# Patient Record
Sex: Female | Born: 1995 | Race: White | Hispanic: No | Marital: Married | Smoking: Former smoker
Health system: Southern US, Community
[De-identification: ages and names within clinical notes are randomized; demographics above are authoritative.]

## PROBLEM LIST (undated history)

## (undated) DIAGNOSIS — R45851 Suicidal ideations: Secondary | ICD-10-CM

## (undated) DIAGNOSIS — Z789 Other specified health status: Secondary | ICD-10-CM

## (undated) DIAGNOSIS — E119 Type 2 diabetes mellitus without complications: Secondary | ICD-10-CM

## (undated) DIAGNOSIS — I10 Essential (primary) hypertension: Secondary | ICD-10-CM

## (undated) DIAGNOSIS — F32A Depression, unspecified: Secondary | ICD-10-CM

## (undated) DIAGNOSIS — R569 Unspecified convulsions: Secondary | ICD-10-CM

## (undated) DIAGNOSIS — F419 Anxiety disorder, unspecified: Secondary | ICD-10-CM

## (undated) DIAGNOSIS — G43909 Migraine, unspecified, not intractable, without status migrainosus: Secondary | ICD-10-CM

## (undated) DIAGNOSIS — E785 Hyperlipidemia, unspecified: Secondary | ICD-10-CM

## (undated) HISTORY — DX: Anxiety disorder, unspecified: F41.9

## (undated) HISTORY — DX: Type 2 diabetes mellitus without complications: E11.9

## (undated) HISTORY — DX: Migraine, unspecified, not intractable, without status migrainosus: G43.909

## (undated) HISTORY — DX: Unspecified convulsions: R56.9

## (undated) HISTORY — DX: Suicidal ideations: R45.851

## (undated) HISTORY — PX: TONSILLECTOMY: SUR1361

## (undated) HISTORY — DX: Hyperlipidemia, unspecified: E78.5

## (undated) HISTORY — DX: Depression, unspecified: F32.A

---

## 2020-11-21 ENCOUNTER — Other Ambulatory Visit: Payer: Self-pay

## 2020-11-21 ENCOUNTER — Emergency Department: Payer: Self-pay

## 2020-11-21 DIAGNOSIS — K8066 Calculus of gallbladder and bile duct with acute and chronic cholecystitis without obstruction: Secondary | ICD-10-CM | POA: Insufficient documentation

## 2020-11-21 DIAGNOSIS — F17210 Nicotine dependence, cigarettes, uncomplicated: Secondary | ICD-10-CM | POA: Insufficient documentation

## 2020-11-21 LAB — CBC
HCT: 40.4 % (ref 36.0–46.0)
Hemoglobin: 13.5 g/dL (ref 12.0–15.0)
MCH: 30.1 pg (ref 26.0–34.0)
MCHC: 33.4 g/dL (ref 30.0–36.0)
MCV: 90 fL (ref 80.0–100.0)
Platelets: 290 10*3/uL (ref 150–400)
RBC: 4.49 MIL/uL (ref 3.87–5.11)
RDW: 13.6 % (ref 11.5–15.5)
WBC: 8.3 10*3/uL (ref 4.0–10.5)
nRBC: 0 % (ref 0.0–0.2)

## 2020-11-21 LAB — BASIC METABOLIC PANEL
Anion gap: 9 (ref 5–15)
BUN: 10 mg/dL (ref 6–20)
CO2: 25 mmol/L (ref 22–32)
Calcium: 9.3 mg/dL (ref 8.9–10.3)
Chloride: 106 mmol/L (ref 98–111)
Creatinine, Ser: 0.75 mg/dL (ref 0.44–1.00)
GFR, Estimated: >60 mL/min (ref >60)
Glucose, Bld: 105 mg/dL — ABNORMAL HIGH (ref 70–99)
Potassium: 3.6 mmol/L (ref 3.5–5.1)
Sodium: 140 mmol/L (ref 135–145)

## 2020-11-21 LAB — TROPONIN I (HIGH SENSITIVITY): Troponin I (High Sensitivity): <2 ng/L (ref <18)

## 2020-11-21 NOTE — ED Triage Notes (Signed)
Pt presents to ER c/o right-sided chest pain that has been persistent since July last year but tonight has become worse and radiates to her right side of back and is also worse with inspiration.  Pt denies hx of asthma.  Pt endorses hx of anxiety but states she has had no anxiety attacks recently.  Pt in NAD in triage.  Pt ambulatory to triage.

## 2020-11-22 ENCOUNTER — Emergency Department: Payer: Self-pay

## 2020-11-22 ENCOUNTER — Emergency Department
Admission: EM | Admit: 2020-11-22 | Discharge: 2020-11-22 | Disposition: A | Discharge status: Home / Self Care | Payer: Self-pay | Attending: Emergency Medicine | Admitting: Emergency Medicine

## 2020-11-22 DIAGNOSIS — K8050 Calculus of bile duct without cholangitis or cholecystitis without obstruction: Secondary | ICD-10-CM

## 2020-11-22 LAB — LIPASE, BLOOD: Lipase: 27 U/L (ref 11–51)

## 2020-11-22 LAB — HEPATIC FUNCTION PANEL
ALT: 18 U/L (ref 0–44)
AST: 19 U/L (ref 15–41)
Albumin: 4.3 g/dL (ref 3.5–5.0)
Alkaline Phosphatase: 55 U/L (ref 38–126)
Bilirubin, Direct: <0.1 mg/dL (ref 0.0–0.2)
Total Bilirubin: 0.6 mg/dL (ref 0.3–1.2)
Total Protein: 7.8 g/dL (ref 6.5–8.1)

## 2020-11-22 LAB — TROPONIN I (HIGH SENSITIVITY): Troponin I (High Sensitivity): <2 ng/L (ref <18)

## 2020-11-22 LAB — POC URINE PREG, ED: Preg Test, Ur: NEGATIVE — NL

## 2020-11-22 MED ORDER — OXYCODONE-ACETAMINOPHEN 5-325 MG PO TABS
2.0000 | ORAL_TABLET | Freq: Once | ORAL | Status: AC
  Administered 2020-11-22: 2 via ORAL
  Filled 2020-11-22: qty 2

## 2020-11-22 MED ORDER — DOCUSATE SODIUM 100 MG PO CAPS: ORAL_CAPSULE | ORAL | 0 refills | Status: AC

## 2020-11-22 MED ORDER — DICYCLOMINE HCL 10 MG PO CAPS
10.0000 mg | ORAL_CAPSULE | Freq: Once | ORAL | Status: AC
  Administered 2020-11-22: 10 mg via ORAL
  Filled 2020-11-22: qty 1

## 2020-11-22 MED ORDER — ONDANSETRON 4 MG PO TBDP
4.0000 mg | ORAL_TABLET | Freq: Once | ORAL | Status: AC
  Administered 2020-11-22: 4 mg via ORAL
  Filled 2020-11-22: qty 1

## 2020-11-22 MED ORDER — ONDANSETRON 4 MG PO TBDP: ORAL_TABLET | ORAL | 0 refills | Status: AC

## 2020-11-22 MED ORDER — OXYCODONE-ACETAMINOPHEN 5-325 MG PO TABS: 2.0000 | ORAL_TABLET | Freq: Four times a day (QID) | ORAL | 0 refills | Status: DC | PRN

## 2020-11-22 NOTE — ED Notes (Signed)
Pt ambulatory from triage -- now sitting up to edge of bed awaiting provider.  Anxious expression noted - denies any needs questions concerns at this time.

## 2020-11-22 NOTE — ED Notes (Signed)
ED Provider at bedside. 

## 2020-11-22 NOTE — ED Notes (Signed)
[facility] ELECTRONIC SIGNATURE UNAVAILABLE-- HARDCOPY OF DISCHARGE SIGNATURE PAGE SIGNED AND FILED

## 2020-11-22 NOTE — ED Notes (Addendum)
Pt reports ongoing R side cp radiating to the back - states sore, aching and TTP - denies recent injury, trauma fall - states pain has been constant since symptom onset 1400hrs day of arrival.  RR even and unlabored on RA -- lung sounds CTA.  S1 and S2 regular upon auscultation.  Adomen soft, nontender- pt denies nausea but states vomiting episode while in waiting room.  Pt also mentions has had similar pain complaint in the past but had never been this severe and had never persisted for more than three hours - states in the past pain relieved with hot showers and/or muscle relaxer

## 2020-11-22 NOTE — Discharge Instructions (Addendum)
You have been seen in the Emergency Department (ED) for abdominal pain.  Your evaluation suggests that your pain is caused by gallstones.  Fortunately you do not need immediate surgery at this time, but it is important that you follow up with a surgeon as an outpatient; typically surgical removal of the gallbladder is the only thing that will definitively fix your issue.  Read through the included information about a bland diet, and use any prescribed medications as instructed.  Avoid smoking and alcohol use. ° °Please follow up as instructed above regarding today’s emergent visit and the symptoms that are bothering you. ° °Take Percocet as prescribed. Do not drink alcohol, drive or participate in any other potentially dangerous activities while taking this medication as it may make you sleepy. Do not take this medication with any other sedating medications, either prescription or over-the-counter. If you were prescribed Percocet or Vicodin, do not take these with acetaminophen (Tylenol) as it is already contained within these medications. °  °This medication is an opiate (or narcotic) pain medication and can be habit forming.  Use it as little as possible to achieve adequate pain control.  Do not use or use it with extreme caution if you have a history of opiate abuse or dependence.  If you are on a pain contract with your primary care doctor or a pain specialist, be sure to let them know you were prescribed this medication today from the [facility].  This medication is intended for your use only - do not give any to anyone else and keep it in a secure place where nobody else, especially children, have access to it.  It will also cause or worsen constipation, so you may want to consider taking an over-the-counter stool softener while you are taking this medication. ° °Return to the ED if your abdominal pain worsens or fails to improve, you develop bloody vomiting, bloody diarrhea, you  are unable to tolerate fluids due to vomiting, fever greater than 101, or other symptoms that concern you. ° °

## 2020-11-22 NOTE — ED Provider Notes (Signed)
[facility] Emergency Department Provider Note  ____________________________________________   Event Date/Time   First MD Initiated Contact with Patient 11/22/20 [phone]     (approximate)  I have reviewed the triage vital signs and the nursing notes.   HISTORY  Chief Complaint Chest Pain    HPI Jasmine Sanchez is a 25 y.o. female reports no contributory chronic medical issues and presents for evaluation of episodic pain in the right side of her chest that has been going on for months.  She said that it used to happen about once a month but it has been having a lot more often recently and the pain has been constant for at least a day.  It is a sharp stabbing pain in the right side of her lower chest that radiates straight through to her back.  Nothing in particular seems to make it better or worse including eating or drinking, movement, or breathing, although when she takes deep breaths recently it seems to be worse.  She has no recent fever or sore throat.  No other chest pain.  No shortness of breath, just the occasional pain with deep inspiration.  She says it feels like the sharp pain radiates through to her back.  No lower abdominal pain.  No dysuria.  No history of blood clots in the legs of the lungs, no recent surgeries or immobilizations, no unilateral leg pain or swelling.         History reviewed. No pertinent past medical history.  There are no problems to display for this patient.   Past Surgical History:  Procedure Laterality Date  . TONSILLECTOMY      Prior to Admission medications   Medication Sig Start Date End Date Taking? Authorizing Provider  docusate sodium (COLACE) 100 MG capsule Take 1 tablet once or twice daily as needed for constipation while taking narcotic pain medicine 11/22/20  Yes Loleta Rose, MD  ondansetron (ZOFRAN ODT) 4 MG disintegrating tablet Allow 1-2 tablets to dissolve in your mouth every 8 hours as needed for  nausea/vomiting 11/22/20  Yes Loleta Rose, MD  oxyCODONE-acetaminophen (PERCOCET) 5-325 MG tablet Take 2 tablets by mouth every 6 (six) hours as needed for severe pain. 11/22/20  Yes Loleta Rose, MD    Allergies Penicillins  History reviewed. No pertinent family history.  Social History Social History   Tobacco Use  . Smoking status: Current Every Day Smoker    Packs/day: 0.20    Types: Cigarettes  . Smokeless tobacco: Never Used  Substance Use Topics  . Alcohol use: Yes    Comment: occasional  . Drug use: Never    Review of Systems Constitutional: No fever/chills Eyes: No visual changes. ENT: No sore throat. Cardiovascular: Episodic right-sided chest pain for months. Respiratory: Denies shortness of breath. Gastrointestinal: No abdominal pain.  No nausea, no vomiting.  No diarrhea.  No constipation. Genitourinary: Negative for dysuria. Musculoskeletal: Negative for neck pain.  Negative for back pain. Integumentary: Negative for rash. Neurological: Negative for headaches, focal weakness or numbness.   ____________________________________________   PHYSICAL EXAM:  VITAL SIGNS: ED Triage Vitals  Enc Vitals Group     BP 11/21/20 2248 (!) 149/91     Pulse Rate 11/21/20 2248 65     Resp 11/21/20 2248 (!) 22     Temp 11/21/20 2248 98.9 F (37.2 C)     Temp Source 11/21/20 2248 Oral     SpO2 11/21/20 2248 100 %     Weight 11/21/20 2249  133.8 kg (295 lb)     Height 11/21/20 2249 1.778 m (5\' 10" )     Head Circumference --      Peak Flow --      Pain Score 11/21/20 2249 8     Pain Loc --      Pain Edu? --      Excl. in GC? --     Constitutional: Alert and oriented.  Eyes: Conjunctivae are normal.  Head: Atraumatic. Nose: No congestion/rhinnorhea. Mouth/Throat: Patient is wearing a mask. Neck: No stridor.  No meningeal signs.   Cardiovascular: Normal rate, regular rhythm. Good peripheral circulation.  No chest wall tenderness. Respiratory: Normal respiratory  effort.  No retractions. Gastrointestinal: Obese.  Soft and nondistended.  Highly reproducible tenderness to palpation of the epigastrium and right upper quadrant with positive Murphy sign.  No lower abdominal tenderness. Musculoskeletal: No lower extremity tenderness nor edema. No gross deformities of extremities. Neurologic:  Normal speech and language. No gross focal neurologic deficits are appreciated.  Skin:  Skin is warm, dry and intact.   ____________________________________________   LABS (all labs ordered are listed, but only abnormal results are displayed)  Labs Reviewed  BASIC METABOLIC PANEL - Abnormal; Notable for the following components:      Result Value   Glucose, Bld 105 (*)    All other components within normal limits  POC URINE PREG, ED - Normal  CBC  HEPATIC FUNCTION PANEL  LIPASE, BLOOD  TROPONIN I (HIGH SENSITIVITY)  TROPONIN I (HIGH SENSITIVITY)   ____________________________________________  EKG  ED ECG REPORT I, Loleta Rose, the attending physician, personally viewed and interpreted this ECG.  Date: 11/21/2020 EKG Time: 22: 42 Rate: 75 Rhythm: normal sinus rhythm with sinus arrhythmia. QRS Axis: normal Intervals: normal ST/T Wave abnormalities: normal Narrative Interpretation: no evidence of acute ischemia  ____________________________________________  RADIOLOGY I, Loleta Rose, personally viewed and evaluated these images (plain radiographs) as part of my medical decision making, as well as reviewing the written report by the radiologist.  ED MD interpretation: Linear atelectasis anteriorly on the lateral view, no other acute abnormality identified.  Official radiology report(s): DG Chest 2 View  Result Date: 11/21/2020 CLINICAL DATA:  Chest pain, shortness of breath EXAM: CHEST - 2 VIEW COMPARISON:  None. FINDINGS: Linear atelectasis anteriorly at the lung bases on the lateral view. No confluent opacities otherwise. Heart is normal size.  No effusions or acute bony abnormality. IMPRESSION: Linear atelectasis anteriorly on the lateral view. No active disease. Electronically Signed   By: Charlett Nose M.D.   On: 11/21/2020 23:31   US ABDOMEN LIMITED RUQ (LIVER/GB)  Result Date: 11/22/2020 CLINICAL DATA:  Episodic epigastric and right upper quadrant pain EXAM: ULTRASOUND ABDOMEN LIMITED RIGHT UPPER QUADRANT COMPARISON:  None. FINDINGS: Gallbladder: 16 mm gallstone at the gallbladder neck with adjacent sludge. There are additional smaller calculi seen towards the fundus. Borderline gallbladder wall thickening to 3-4 mm. No focal tenderness per sonographer exam. Common bile duct: Diameter: 4 mm Liver: No focal lesion identified. Within normal limits in parenchymal echogenicity. Portal vein is patent on color Doppler imaging with normal direction of blood flow towards the liver. Other: Detail is diminished by soft tissue thickness. IMPRESSION: Cholelithiasis including stone fixed at the neck. Borderline gallbladder wall thickening but no focal tenderness or adjacent edema to implicate acute cholecystitis. Electronically Signed   By: Marnee Spring M.D.   On: 11/22/2020 05:15    ____________________________________________   PROCEDURES   Procedure(s) performed (including Critical Care):  Procedures   ____________________________________________   INITIAL IMPRESSION / MDM / ASSESSMENT AND PLAN / ED COURSE  As part of my medical decision making, I reviewed the following data within the electronic MEDICAL RECORD NUMBER Nursing notes reviewed and incorporated, Labs reviewed , EKG interpreted , Old chart reviewed, Radiograph reviewed , Notes from prior ED visits and [place] Controlled Substance Database   Differential diagnosis includes, but is not limited to, biliary colic, musculoskeletal pain, PE, pneumonia, less likely ACS.  Patient has no risk factors for PE and is PERC negative.  Very low risk for ACS based on HEAR score.  Physical exam  suggest GI source, specifically gallbladder, rather than thoracic source of her pain.  She is highly sensitive to palpation of the epigastrium and right upper quadrant.  No lower abdominal tenderness.  Vital signs are within normal limits.  CBC normal with no leukocytosis.  High-sensitivity troponin negative x2.  Basic metabolic panel is within normal limits.  I added on hepatic function panel and lipase and have ordered a right upper quadrant ultrasound.  The patient does not feel that she has IV access so hold off on analgesia at this time until after the ultrasound and I will consider p.o. meds.  Patient understands and agrees with the current plan of care.     Clinical Course as of 11/22/20 0540  Sat Nov 22, 2020  0431 Normal hepatic function panel and lipase. [CF]  0537 US ABDOMEN LIMITED RUQ (LIVER/GB) Patient's work-up is consistent with biliary colic.  She has a gallstone in the neck of the gallbladder.  However she is more comfortable now you know the pain persists.  She has not had any vomiting.  There is no sonographic evidence of cholecystitis.  She does not meet criteria for urgent surgery.  I had a discussion with her and her companion about biliary colic and the importance of close outpatient follow-up with surgery.  I gave her 2 Percocet and a Zofran 4 mg ODT as well as Bentyl 10 mg by mouth and prescriptions as listed below.  She knows that it is important to follow-up with surgery and I gave my usual and customary return precautions [CF]    Clinical Course User Index [CF] Loleta Rose, MD     ____________________________________________  FINAL CLINICAL IMPRESSION(S) / ED DIAGNOSES  Final diagnoses:  Biliary colic     MEDICATIONS GIVEN DURING THIS VISIT:  Medications  oxyCODONE-acetaminophen (PERCOCET/ROXICET) 5-325 MG per tablet 2 tablet (has no administration in time range)  ondansetron (ZOFRAN-ODT) disintegrating tablet 4 mg (has no administration in time range)   dicyclomine (BENTYL) capsule 10 mg (has no administration in time range)     ED Discharge Orders         Ordered    oxyCODONE-acetaminophen (PERCOCET) 5-325 MG tablet  Every 6 hours PRN        11/22/20 0537    ondansetron (ZOFRAN ODT) 4 MG disintegrating tablet        11/22/20 0537    docusate sodium (COLACE) 100 MG capsule        11/22/20 0537          *Please note:  Jasmine Sanchez was evaluated in Emergency Department on 11/22/2020 for the symptoms described in the history of present illness. She was evaluated in the context of the global COVID-19 pandemic, which necessitated consideration that the patient might be at risk for infection with the SARS-CoV-2 virus that causes COVID-19. Institutional protocols and algorithms that pertain to the  evaluation of patients at risk for COVID-19 are in a state of rapid change based on information released by regulatory bodies including the CDC and federal and state organizations. These policies and algorithms were followed during the patient's care in the ED.  Some ED evaluations and interventions may be delayed as a result of limited staffing during and after the pandemic.*  Note:  This document was prepared using Dragon voice recognition software and may include unintentional dictation errors.   Loleta Rose, MD 11/22/20 [phone]

## 2020-11-25 ENCOUNTER — Telehealth: Payer: Self-pay | Admitting: Surgery

## 2020-11-25 NOTE — Telephone Encounter (Signed)
Outbound call made to pt in an attempt to schedule ED follow up appt.  Pt advised she is currently feeling well; no recurring issues since being seen/tx'd in the ED & stated she is uninsured, therefore she will attempt to go through all of the pt assistance ppwk that was given to her while in the ED.  Furthermore, she is looking to follow up care through [facility] but will reach out to the office if she wishes to schedule @ [facility].

## 2021-01-24 ENCOUNTER — Emergency Department (HOSPITAL_COMMUNITY): Payer: Self-pay

## 2021-01-24 ENCOUNTER — Ambulatory Visit (HOSPITAL_COMMUNITY)
Admission: EM | Admit: 2021-01-24 | Discharge: 2021-01-24 | Disposition: A | Discharge status: Home / Self Care | Payer: Self-pay | Attending: Emergency Medicine | Admitting: Emergency Medicine

## 2021-01-24 ENCOUNTER — Emergency Department (HOSPITAL_COMMUNITY): Payer: Self-pay | Admitting: Anesthesiology

## 2021-01-24 ENCOUNTER — Encounter (HOSPITAL_COMMUNITY): Payer: Self-pay

## 2021-01-24 ENCOUNTER — Encounter (HOSPITAL_COMMUNITY)
Admission: EM | Disposition: A | Discharge status: Home / Self Care | Payer: Self-pay | Source: Home / Self Care | Attending: Emergency Medicine

## 2021-01-24 ENCOUNTER — Other Ambulatory Visit: Payer: Self-pay

## 2021-01-24 DIAGNOSIS — Z87891 Personal history of nicotine dependence: Secondary | ICD-10-CM | POA: Insufficient documentation

## 2021-01-24 DIAGNOSIS — K8020 Calculus of gallbladder without cholecystitis without obstruction: Secondary | ICD-10-CM

## 2021-01-24 DIAGNOSIS — Z880 Allergy status to penicillin: Secondary | ICD-10-CM | POA: Insufficient documentation

## 2021-01-24 DIAGNOSIS — Z20822 Contact with and (suspected) exposure to covid-19: Secondary | ICD-10-CM | POA: Insufficient documentation

## 2021-01-24 DIAGNOSIS — R1011 Right upper quadrant pain: Secondary | ICD-10-CM

## 2021-01-24 DIAGNOSIS — K8010 Calculus of gallbladder with chronic cholecystitis without obstruction: Secondary | ICD-10-CM | POA: Insufficient documentation

## 2021-01-24 DIAGNOSIS — N3000 Acute cystitis without hematuria: Secondary | ICD-10-CM | POA: Insufficient documentation

## 2021-01-24 HISTORY — PX: CHOLECYSTECTOMY: SHX55

## 2021-01-24 HISTORY — DX: Other specified health status: Z78.9

## 2021-01-24 LAB — COMPREHENSIVE METABOLIC PANEL
ALT: 95 U/L — ABNORMAL HIGH (ref 0–44)
AST: 65 U/L — ABNORMAL HIGH (ref 15–41)
Albumin: 4 g/dL (ref 3.5–5.0)
Alkaline Phosphatase: 180 U/L — ABNORMAL HIGH (ref 38–126)
Anion gap: 6 (ref 5–15)
BUN: 11 mg/dL (ref 6–20)
CO2: 30 mmol/L (ref 22–32)
Calcium: 9.1 mg/dL (ref 8.9–10.3)
Chloride: 104 mmol/L (ref 98–111)
Creatinine, Ser: 0.83 mg/dL (ref 0.44–1.00)
GFR, Estimated: >60 mL/min (ref >60)
Glucose, Bld: 117 mg/dL — ABNORMAL HIGH (ref 70–99)
Potassium: 3.5 mmol/L (ref 3.5–5.1)
Sodium: 140 mmol/L (ref 135–145)
Total Bilirubin: 0.4 mg/dL (ref 0.3–1.2)
Total Protein: 7.2 g/dL (ref 6.5–8.1)

## 2021-01-24 LAB — CBC
HCT: 40.3 % (ref 36.0–46.0)
Hemoglobin: 13.1 g/dL (ref 12.0–15.0)
MCH: 30.2 pg (ref 26.0–34.0)
MCHC: 32.5 g/dL (ref 30.0–36.0)
MCV: 92.9 fL (ref 80.0–100.0)
Platelets: 303 10*3/uL (ref 150–400)
RBC: 4.34 MIL/uL (ref 3.87–5.11)
RDW: 13.4 % (ref 11.5–15.5)
WBC: 9.1 10*3/uL (ref 4.0–10.5)
nRBC: 0 % (ref 0.0–0.2)

## 2021-01-24 LAB — URINALYSIS, ROUTINE W REFLEX MICROSCOPIC
Bilirubin Urine: NEGATIVE
Glucose, UA: NEGATIVE mg/dL
Hgb urine dipstick: NEGATIVE
Ketones, ur: NEGATIVE mg/dL
Nitrite: NEGATIVE
Protein, ur: NEGATIVE mg/dL
Specific Gravity, Urine: 1.019 (ref 1.005–1.030)
WBC, UA: >50 WBC/hpf — ABNORMAL HIGH (ref 0–5)
pH: 5 (ref 5.0–8.0)

## 2021-01-24 LAB — I-STAT BETA HCG BLOOD, ED (MC, WL, AP ONLY): I-stat hCG, quantitative: <5 m[IU]/mL (ref <5)

## 2021-01-24 LAB — RESP PANEL BY RT-PCR (FLU A&B, COVID) ARPGX2
Influenza A by PCR: NEGATIVE
Influenza B by PCR: NEGATIVE
SARS Coronavirus 2 by RT PCR: NEGATIVE

## 2021-01-24 LAB — LIPASE, BLOOD: Lipase: 29 U/L (ref 11–51)

## 2021-01-24 SURGERY — LAPAROSCOPIC CHOLECYSTECTOMY WITH INTRAOPERATIVE CHOLANGIOGRAM
Anesthesia: General | Site: Abdomen

## 2021-01-24 MED ORDER — SUCCINYLCHOLINE CHLORIDE 20 MG/ML IJ SOLN
INTRAMUSCULAR | Status: DC | PRN
  Administered 2021-01-24: 120 mg via INTRAVENOUS

## 2021-01-24 MED ORDER — FENTANYL CITRATE (PF) 100 MCG/2ML IJ SOLN
25.0000 ug | INTRAMUSCULAR | Status: DC | PRN
  Administered 2021-01-24: 50 ug via INTRAVENOUS

## 2021-01-24 MED ORDER — DEXAMETHASONE SODIUM PHOSPHATE 10 MG/ML IJ SOLN
INTRAMUSCULAR | Status: DC | PRN
  Administered 2021-01-24: 10 mg via INTRAVENOUS

## 2021-01-24 MED ORDER — SODIUM CHLORIDE 0.9 % IV BOLUS
1000.0000 mL | Freq: Once | INTRAVENOUS | Status: AC
  Administered 2021-01-24: 1000 mL via INTRAVENOUS

## 2021-01-24 MED ORDER — LIDOCAINE HCL (PF) 1 % IJ SOLN
INTRAMUSCULAR | Status: AC
  Filled 2021-01-24: qty 30

## 2021-01-24 MED ORDER — PROMETHAZINE HCL 25 MG/ML IJ SOLN: 6.2500 mg | INTRAMUSCULAR | Status: DC | PRN

## 2021-01-24 MED ORDER — ONDANSETRON HCL 4 MG/2ML IJ SOLN
4.0000 mg | Freq: Once | INTRAMUSCULAR | Status: AC
  Administered 2021-01-24: 4 mg via INTRAVENOUS
  Filled 2021-01-24: qty 2

## 2021-01-24 MED ORDER — BUPIVACAINE-EPINEPHRINE 0.25% -1:200000 IJ SOLN
INTRAMUSCULAR | Status: DC | PRN
  Administered 2021-01-24: 20 mL

## 2021-01-24 MED ORDER — FENTANYL CITRATE (PF) 250 MCG/5ML IJ SOLN
INTRAMUSCULAR | Status: AC
  Filled 2021-01-24: qty 5

## 2021-01-24 MED ORDER — PROPOFOL 10 MG/ML IV BOLUS
INTRAVENOUS | Status: AC
  Filled 2021-01-24: qty 20

## 2021-01-24 MED ORDER — FENTANYL CITRATE (PF) 100 MCG/2ML IJ SOLN
INTRAMUSCULAR | Status: DC | PRN
  Administered 2021-01-24: 50 ug via INTRAVENOUS
  Administered 2021-01-24: 100 ug via INTRAVENOUS

## 2021-01-24 MED ORDER — 0.9 % SODIUM CHLORIDE (POUR BTL) OPTIME
TOPICAL | Status: DC | PRN
  Administered 2021-01-24: 1000 mL

## 2021-01-24 MED ORDER — PROPOFOL 10 MG/ML IV BOLUS
INTRAVENOUS | Status: DC | PRN
  Administered 2021-01-24: 170 mg via INTRAVENOUS

## 2021-01-24 MED ORDER — ROCURONIUM BROMIDE 10 MG/ML (PF) SYRINGE
PREFILLED_SYRINGE | INTRAVENOUS | Status: DC | PRN
  Administered 2021-01-24 (×2): 50 mg via INTRAVENOUS

## 2021-01-24 MED ORDER — LACTATED RINGERS IV SOLN: INTRAVENOUS | Status: DC

## 2021-01-24 MED ORDER — ACETAMINOPHEN 10 MG/ML IV SOLN
INTRAVENOUS | Status: DC | PRN
  Administered 2021-01-24: 1000 mg via INTRAVENOUS

## 2021-01-24 MED ORDER — MIDAZOLAM HCL 2 MG/2ML IJ SOLN
INTRAMUSCULAR | Status: AC
  Filled 2021-01-24: qty 2

## 2021-01-24 MED ORDER — MIDAZOLAM HCL 5 MG/5ML IJ SOLN
INTRAMUSCULAR | Status: DC | PRN
  Administered 2021-01-24: 2 mg via INTRAVENOUS

## 2021-01-24 MED ORDER — LIDOCAINE 2% (20 MG/ML) 5 ML SYRINGE
INTRAMUSCULAR | Status: DC | PRN
  Administered 2021-01-24: 100 mg via INTRAVENOUS

## 2021-01-24 MED ORDER — HYDROMORPHONE HCL 1 MG/ML IJ SOLN
0.5000 mg | Freq: Once | INTRAMUSCULAR | Status: AC
  Administered 2021-01-24: 0.5 mg via INTRAVENOUS
  Filled 2021-01-24: qty 1

## 2021-01-24 MED ORDER — IBUPROFEN 800 MG PO TABS: 800.0000 mg | ORAL_TABLET | Freq: Three times a day (TID) | ORAL | 1 refills | Status: AC | PRN

## 2021-01-24 MED ORDER — CIPROFLOXACIN IN D5W 400 MG/200ML IV SOLN
400.0000 mg | Freq: Once | INTRAVENOUS | Status: AC
  Administered 2021-01-24: 400 mg via INTRAVENOUS
  Filled 2021-01-24: qty 200

## 2021-01-24 MED ORDER — BUPIVACAINE-EPINEPHRINE (PF) 0.25% -1:200000 IJ SOLN
INTRAMUSCULAR | Status: AC
  Filled 2021-01-24: qty 30

## 2021-01-24 MED ORDER — SUGAMMADEX SODIUM 200 MG/2ML IV SOLN
INTRAVENOUS | Status: DC | PRN
  Administered 2021-01-24: 500 mg via INTRAVENOUS

## 2021-01-24 MED ORDER — METRONIDAZOLE IN NACL 5-0.79 MG/ML-% IV SOLN
500.0000 mg | Freq: Once | INTRAVENOUS | Status: AC
  Administered 2021-01-24: 500 mg via INTRAVENOUS
  Filled 2021-01-24: qty 100

## 2021-01-24 MED ORDER — CHLORHEXIDINE GLUCONATE 0.12 % MT SOLN: 15.0000 mL | Freq: Once | OROMUCOSAL | Status: DC

## 2021-01-24 MED ORDER — CHLORHEXIDINE GLUCONATE 0.12 % MT SOLN
OROMUCOSAL | Status: AC
  Administered 2021-01-24: 15 mL
  Filled 2021-01-24: qty 15

## 2021-01-24 MED ORDER — FENTANYL CITRATE (PF) 100 MCG/2ML IJ SOLN
INTRAMUSCULAR | Status: AC
  Filled 2021-01-24: qty 2

## 2021-01-24 MED ORDER — OXYCODONE HCL 5 MG PO TABS: 5.0000 mg | ORAL_TABLET | ORAL | 0 refills | Status: DC | PRN

## 2021-01-24 MED ORDER — ONDANSETRON HCL 4 MG/2ML IJ SOLN
INTRAMUSCULAR | Status: DC | PRN
  Administered 2021-01-24: 4 mg via INTRAVENOUS

## 2021-01-24 MED ORDER — ACETAMINOPHEN 10 MG/ML IV SOLN
INTRAVENOUS | Status: AC
  Filled 2021-01-24: qty 100

## 2021-01-24 SURGICAL SUPPLY — 38 items
APPLIER CLIP 5 13 M/L LIGAMAX5 (MISCELLANEOUS) ×3
BLADE CLIPPER SURG (BLADE) ×3 IMPLANT
CHLORAPREP W/TINT 26 (MISCELLANEOUS) ×3 IMPLANT
CLIP APPLIE 5 13 M/L LIGAMAX5 (MISCELLANEOUS) ×1 IMPLANT
COVER MAYO STAND STRL (DRAPES) IMPLANT
COVER SURGICAL LIGHT HANDLE (MISCELLANEOUS) ×3 IMPLANT
DERMABOND ADVANCED (GAUZE/BANDAGES/DRESSINGS) ×2
DERMABOND ADVANCED .7 DNX12 (GAUZE/BANDAGES/DRESSINGS) ×1 IMPLANT
DISSECTOR BLUNT TIP ENDO 5MM (MISCELLANEOUS) ×3 IMPLANT
DRAPE C-ARM 42X120 X-RAY (DRAPES) IMPLANT
ELECT REM PT RETURN 9FT ADLT (ELECTROSURGICAL) ×3
ELECTRODE REM PT RTRN 9FT ADLT (ELECTROSURGICAL) ×1 IMPLANT
GLOVE BIO SURGEON STRL SZ 6.5 (GLOVE) ×8 IMPLANT
GLOVE BIO SURGEONS STRL SZ 6.5 (GLOVE) ×4
GLOVE BIOGEL PI IND STRL 6 (GLOVE) ×3 IMPLANT
GLOVE BIOGEL PI INDICATOR 6 (GLOVE) ×6
GOWN STRL REUS W/ TWL LRG LVL3 (GOWN DISPOSABLE) ×3 IMPLANT
GOWN STRL REUS W/TWL LRG LVL3 (GOWN DISPOSABLE) ×6
KIT BASIN OR (CUSTOM PROCEDURE TRAY) ×3 IMPLANT
KIT TURNOVER KIT B (KITS) ×3 IMPLANT
NS IRRIG 1000ML POUR BTL (IV SOLUTION) ×3 IMPLANT
PAD ARMBOARD 7.5X6 YLW CONV (MISCELLANEOUS) ×3 IMPLANT
POUCH SPECIMEN RETRIEVAL 10MM (ENDOMECHANICALS) ×3 IMPLANT
SCISSORS LAP 5X35 DISP (ENDOMECHANICALS) ×3 IMPLANT
SET CHOLANGIOGRAPH 5 50 .035 (SET/KITS/TRAYS/PACK) IMPLANT
SET IRRIG TUBING LAPAROSCOPIC (IRRIGATION / IRRIGATOR) IMPLANT
SET TUBE SMOKE EVAC HIGH FLOW (TUBING) ×3 IMPLANT
SLEEVE ENDOPATH XCEL 5M (ENDOMECHANICALS) ×6 IMPLANT
SPECIMEN JAR SMALL (MISCELLANEOUS) ×3 IMPLANT
SUT MNCRL AB 4-0 PS2 18 (SUTURE) ×3 IMPLANT
SUT VIC AB 0 UR5 27 (SUTURE) ×3 IMPLANT
SUT VICRYL 0 UR6 27IN ABS (SUTURE) ×3 IMPLANT
TOWEL GREEN STERILE (TOWEL DISPOSABLE) ×3 IMPLANT
TOWEL GREEN STERILE FF (TOWEL DISPOSABLE) ×3 IMPLANT
TRAY LAPAROSCOPIC MC (CUSTOM PROCEDURE TRAY) ×3 IMPLANT
TROCAR XCEL BLUNT TIP 100MML (ENDOMECHANICALS) ×3 IMPLANT
TROCAR XCEL NON-BLD 5MMX100MML (ENDOMECHANICALS) ×3 IMPLANT
TUBING INSUFFLATION (TUBING) ×3 IMPLANT

## 2021-01-24 NOTE — Discharge Instructions (Addendum)
May shower beginning 01/25/2021. Do not peel off or scrub skin glue. May allow warm soapy water to run over incision, then rinse and pat dry. Do not soak in any water (tubs, hot tubs, pools, lakes, oceans) for one week.   No lifting greater than 5 pounds for six weeks.   Pain regimen: take over-the-counter tylenol (acetaminophen) 1000mg  every six hours and the prescription ibuprofen (800mg ) every eight hours. You also have a prescription for oxycodone, which should be taken if the tylenol (acetaminophen) and ibuprofen are not enough to control your pain. You may take the oxycodone as frequently as every six hours as needed. You have also been given a prescription for colace (docusate) which is a stool softener. Please take this as prescribed because the oxycodone can cause constipation and the colace (docusate) will minimize or prevent constipation.  Call the office at [phone] for temperature greater than 101.41F, worsening pain, redness or warmth at the incision site.  Please call [phone] to make an appointment for 2 week after surgery for wound check.

## 2021-01-24 NOTE — Progress Notes (Signed)
Patient seen and examined. Informed consent was obtained after detailed explanation of risks, including bleeding, infection, biloma, need for IOC to delineate anatomy, and need for conversion to open procedure. All questions answered to the patient's satisfaction.  Machai Desmith N. Carolin Quang, MD General and Trauma Surgery [facility]  

## 2021-01-24 NOTE — ED Notes (Signed)
Patient transported to Ultrasound 

## 2021-01-24 NOTE — ED Provider Notes (Signed)
[facility] EMERGENCY DEPARTMENT Provider Note   CSN: [id number] Arrival date & time: 01/24/21  0454     History Chief Complaint  Patient presents with  . Abdominal Pain    Jasmine Sanchez is a 25 y.o. female.  Patient with history of gallbladder stones presents the emergency department today for right upper quadrant pain.  Patient has been having intermittent episodes over the past year.  She was seen at [facility] in February and had a right upper quadrant ultrasound demonstrating gallstones.  Patient states that her symptoms typically last about 30 minutes, if she is able to take pain medication shortly after onset.  Current symptoms are more severe than normal with severe right upper quadrant pain, vomiting, radiation of pain to her back and chest.  Current pain has been ongoing for more than 8 hours.  She took at home pain medication prescribed at previous ED visit, oxycodone, without improvement.  She has had dysuria.  No diarrhea.  The onset of this condition was acute. The course is constant. Aggravating factors: none. Alleviating factors: none.          Past Medical History:  Diagnosis Date  . Known health problems: none     There are no problems to display for this patient.   Past Surgical History:  Procedure Laterality Date  . TONSILLECTOMY       OB History   No obstetric history on file.     History reviewed. No pertinent family history.  Social History   Tobacco Use  . Smoking status: Former Smoker    Packs/day: 0.20    Types: Cigarettes  . Smokeless tobacco: Never Used  Vaping Use  . Vaping Use: Never used  Substance Use Topics  . Alcohol use: Yes    Comment: occasional  . Drug use: Never    Home Medications Prior to Admission medications   Medication Sig Start Date End Date Taking? Authorizing Provider  docusate sodium (COLACE) 100 MG capsule Take 1 tablet once or twice daily as needed for constipation while taking narcotic  pain medicine 11/22/20   Loleta Rose, MD  ondansetron (ZOFRAN ODT) 4 MG disintegrating tablet Allow 1-2 tablets to dissolve in your mouth every 8 hours as needed for nausea/vomiting 11/22/20   Loleta Rose, MD  oxyCODONE-acetaminophen (PERCOCET) 5-325 MG tablet Take 2 tablets by mouth every 6 (six) hours as needed for severe pain. 11/22/20   Loleta Rose, MD    Allergies    Penicillins  Review of Systems   Review of Systems  Constitutional: Negative for fever.  HENT: Negative for rhinorrhea and sore throat.   Eyes: Negative for redness.  Respiratory: Negative for cough.   Cardiovascular: Positive for chest pain.  Gastrointestinal: Positive for abdominal pain, nausea and vomiting. Negative for diarrhea.  Genitourinary: Positive for dysuria. Negative for frequency, hematuria and urgency.  Musculoskeletal: Positive for back pain. Negative for myalgias.  Skin: Negative for rash.  Neurological: Negative for headaches.    Physical Exam Updated Vital Signs BP 133/76   Pulse 69   Temp 98.3 F (36.8 C) (Oral)   Resp 16   Ht 5\' 9"  (1.753 m)   Wt 134.7 kg   LMP 01/10/2021 (Approximate)   SpO2 100%   BMI 43.86 kg/m   Physical Exam Vitals and nursing note reviewed.  Constitutional:      General: She is in acute distress (uncomfortable appearing).     Appearance: She is well-developed. She is not ill-appearing.  HENT:  Head: Normocephalic and atraumatic.     Right Ear: External ear normal.     Left Ear: External ear normal.     Nose: Nose normal.  Eyes:     Conjunctiva/sclera: Conjunctivae normal.  Cardiovascular:     Rate and Rhythm: Normal rate and regular rhythm.     Heart sounds: No murmur heard.   Pulmonary:     Effort: No respiratory distress.     Breath sounds: No wheezing, rhonchi or rales.  Abdominal:     Palpations: Abdomen is soft.     Tenderness: There is abdominal tenderness in the right upper quadrant. There is no guarding. Positive signs include Murphy's  sign.  Musculoskeletal:     Cervical back: Normal range of motion and neck supple.     Right lower leg: No edema.     Left lower leg: No edema.  Skin:    General: Skin is warm and dry.     Findings: No rash.  Neurological:     General: No focal deficit present.     Mental Status: She is alert. Mental status is at baseline.     Motor: No weakness.  Psychiatric:        Mood and Affect: Mood normal.     ED Results / Procedures / Treatments   Labs (all labs ordered are listed, but only abnormal results are displayed) Labs Reviewed  COMPREHENSIVE METABOLIC PANEL - Abnormal; Notable for the following components:      Result Value   Glucose, Bld 117 (*)    AST 65 (*)    ALT 95 (*)    Alkaline Phosphatase 180 (*)    All other components within normal limits  URINALYSIS, ROUTINE W REFLEX MICROSCOPIC - Abnormal; Notable for the following components:   APPearance CLOUDY (*)    Leukocytes,Ua LARGE (*)    WBC, UA >50 (*)    Bacteria, UA RARE (*)    All other components within normal limits  URINE CULTURE  LIPASE, BLOOD  CBC  I-STAT BETA HCG BLOOD, ED ([facility], [facility], [facility] ONLY)    EKG None  Radiology No results found.  Procedures Procedures   Medications Ordered in ED Medications  HYDROmorphone (DILAUDID) injection 0.5 mg (0.5 mg Intravenous Given 01/24/21 0851)  ondansetron (ZOFRAN) injection 4 mg (4 mg Intravenous Given 01/24/21 0850)  sodium chloride 0.9 % bolus 1,000 mL (0 mLs Intravenous Stopped 01/24/21 1014)  ciprofloxacin (CIPRO) IVPB 400 mg (0 mg Intravenous Stopped 01/24/21 1014)    ED Course  I have reviewed the triage vital signs and the nursing notes.  Pertinent labs & imaging results that were available during my care of the patient were reviewed by me and considered in my medical decision making (see chart for details).  Patient seen and examined. Work-up initiated. Medications ordered.   Vital signs reviewed and are as follows: BP 133/76   Pulse 69   Temp 98.3 F  (36.8 C) (Oral)   Resp 16   Ht 5\' 9"  (1.753 m)   Wt 134.7 kg   LMP 01/10/2021 (Approximate)   SpO2 100%   BMI 43.86 kg/m    UTI noted. Will give dose IV cipro and send urine culture.   9:31 AM patient rechecked.  She is more comfortable after pain medication.  Awaiting right upper quadrant ultrasound.  11:38 AM 01/12/2021 without firm signs of cholecystitis.  On reexam, patient's pain is better controlled however she continues to have significant right upper quadrant tenderness to palpation.  Will consult general surgery given tenderness, symptomatic cholelithiasis, change in typical pain pattern.  I spoke with Dr. Derrell Lolling who will see patient.  12:22 PM Surgery to admit for symptomatic cholelithiasis.     MDM Rules/Calculators/A&P                          Admit.     Final Clinical Impression(s) / ED Diagnoses Final diagnoses:  RUQ pain  Symptomatic cholelithiasis  Acute cystitis without hematuria    Rx / DC Orders ED Discharge Orders    None       Renne Crigler, PA-C 01/24/21 1222    Margarita Grizzle, MD 01/25/21 1539

## 2021-01-24 NOTE — H&P (Signed)
Jasmine Sanchez is an 25 y.o. female.   Chief Complaint: Abdominal pain HPI: Patient is a 25 year old female, comes in secondary to right upper quadrant abdominal pain. Patient states she said multiple episodes since June 2021.  She states that this episode began last night at 10 PM.  She states this was associate with nausea and vomiting.  Patient states that she had continued pain he proceeded to the ER. On evaluation the ER she underwent ultrasound.  Ultrasound revealed multiple gallstones however no signs of gallbladder wall thickening.  I did review the ultrasound personally. Patient's LFTs were within normal limits as was for WBC count.  General surgery was consulted for further evaluation and management.  Patient has no previous operative history.  Past Medical History:  Diagnosis Date  . Known health problems: none     Past Surgical History:  Procedure Laterality Date  . TONSILLECTOMY      History reviewed. No pertinent family history. Social History:  reports that she has quit smoking. Her smoking use included cigarettes. She smoked 0.20 packs per day. She has never used smokeless tobacco. She reports current alcohol use. She reports that she does not use drugs.  Allergies:  Allergies  Allergen Reactions  . Penicillins Anaphylaxis    (Not in a hospital admission)   Results for orders placed or performed during the hospital encounter of 01/24/21 (from the past 48 hour(s))  Lipase, blood     Status: None   Collection Time: 01/24/21  5:22 AM  Result Value Ref Range   Lipase 29 11 - 51 U/L    Comment: Performed at [facility], [address], [place], [place] [address]  Comprehensive metabolic panel     Status: Abnormal   Collection Time: 01/24/21  5:22 AM  Result Value Ref Range   Sodium 140 135 - 145 mmol/L   Potassium 3.5 3.5 - 5.1 mmol/L   Chloride 104 98 - 111 mmol/L   CO2 30 22 - 32 mmol/L   Glucose, Bld 117 (H) 70 - 99 mg/dL    Comment: Glucose  reference range applies only to samples taken after fasting for at least 8 hours.   BUN 11 6 - 20 mg/dL   Creatinine, Ser 8.65 0.44 - 1.00 mg/dL   Calcium 9.1 8.9 - 78.4 mg/dL   Total Protein 7.2 6.5 - 8.1 g/dL   Albumin 4.0 3.5 - 5.0 g/dL   AST 65 (H) 15 - 41 U/L   ALT 95 (H) 0 - 44 U/L   Alkaline Phosphatase 180 (H) 38 - 126 U/L   Total Bilirubin 0.4 0.3 - 1.2 mg/dL   GFR, Estimated >69 >62 mL/min    Comment: (NOTE) Calculated using the CKD-EPI Creatinine Equation (2021)    Anion gap 6 5 - 15    Comment: Performed at [facility], [address] [place], [place] [address]  CBC     Status: None   Collection Time: 01/24/21  5:22 AM  Result Value Ref Range   WBC 9.1 4.0 - 10.5 K/uL   RBC 4.34 3.87 - 5.11 MIL/uL   Hemoglobin 13.1 12.0 - 15.0 g/dL   HCT 13.2 44.0 - 10.2 %   MCV 92.9 80.0 - 100.0 fL   MCH 30.2 26.0 - 34.0 pg   MCHC 32.5 30.0 - 36.0 g/dL   RDW 72.5 36.6 - 44.0 %   Platelets 303 150 - 400 K/uL   nRBC 0.0 0.0 - 0.2 %  Comment: Performed at [facility], [address], [place], [place] [address]  I-Stat beta hCG blood, ED     Status: None   Collection Time: 01/24/21  5:26 AM  Result Value Ref Range   I-stat hCG, quantitative <5.0 <5 mIU/mL   Comment 3            Comment:   GEST. AGE      CONC.  (mIU/mL)   <=1 WEEK        5 - 50     2 WEEKS       50 - 500     3 WEEKS       100 - 10,000     4 WEEKS     1,000 - 30,000        FEMALE AND NON-PREGNANT FEMALE:     LESS THAN 5 mIU/mL   Urinalysis, Routine w reflex microscopic Urine, Clean Catch     Status: Abnormal   Collection Time: 01/24/21  6:00 AM  Result Value Ref Range   Color, Urine YELLOW YELLOW   APPearance CLOUDY (A) CLEAR   Specific Gravity, Urine 1.019 1.005 - 1.030   pH 5.0 5.0 - 8.0   Glucose, UA NEGATIVE NEGATIVE mg/dL   Hgb urine dipstick NEGATIVE NEGATIVE   Bilirubin Urine NEGATIVE NEGATIVE   Ketones, ur NEGATIVE NEGATIVE mg/dL   Protein, ur NEGATIVE NEGATIVE mg/dL   Nitrite  NEGATIVE NEGATIVE   Leukocytes,Ua LARGE (A) NEGATIVE   RBC / HPF 6-10 0 - 5 RBC/hpf   WBC, UA >50 (H) 0 - 5 WBC/hpf   Bacteria, UA RARE (A) NONE SEEN   Squamous Epithelial / LPF 0-5 0 - 5   WBC Clumps PRESENT    Mucus PRESENT    Budding Yeast PRESENT     Comment: Performed at [facility], [address], [place], [place] [address]   US Abdomen Limited RUQ (LIVER/GB)  Result Date: 01/24/2021 CLINICAL DATA:  Upper abdominal pain EXAM: ULTRASOUND ABDOMEN LIMITED RIGHT UPPER QUADRANT COMPARISON:  November 22, 2020 FINDINGS: Gallbladder: Within the gallbladder, there are multiple echogenic foci which move and shadow consistent with cholelithiasis. Largest gallstone measures approximately 1.5 cm. No appreciable gallbladder wall thickening or pericholecystic fluid. No sonographic Murphy sign noted by sonographer. Common bile duct: Diameter: 5 mm. No intrahepatic or extrahepatic biliary duct dilatation. Liver: No focal lesion identified. Liver echogenicity overall increased. Portal vein is patent on color Doppler imaging with normal direction of blood flow towards the liver. Other: None. IMPRESSION: Cholelithiasis with multiple gallstones throughout the gallbladder. No gallbladder wall thickening evident. No pericholecystic fluid. Increased liver echogenicity is felt to be indicative of a degree of hepatic steatosis. No focal liver lesions evident. Electronically Signed   By: Bretta Bang III M.D.   On: 01/24/2021 10:53    Review of Systems  Constitutional: Negative for chills and fever.  HENT: Negative for ear discharge, hearing loss and sore throat.   Eyes: Negative for discharge.  Respiratory: Negative for cough and shortness of breath.   Cardiovascular: Negative for chest pain and leg swelling.  Gastrointestinal: Positive for abdominal pain, constipation, nausea and vomiting. Negative for diarrhea.  Musculoskeletal: Negative for myalgias and neck pain.  Skin: Negative for rash.   Allergic/Immunologic: Negative for environmental allergies.  Neurological: Negative for dizziness and seizures.  Hematological: Does not bruise/bleed easily.  Psychiatric/Behavioral: Negative for suicidal ideas.  All other systems reviewed and are negative.   Blood pressure 129/70, pulse 69,  temperature 98.3 F (36.8 C), temperature source Oral, resp. rate 17, height 5\' 9"  (1.753 m), weight 134.7 kg, last menstrual period 01/10/2021, SpO2 99 %. Physical Exam Constitutional:      Appearance: She is well-developed.     Comments: Conversant No acute distress  Eyes:     General: Lids are normal. No scleral icterus.    Comments: Pupils are equal round and reactive No lid lag Moist conjunctiva  Neck:     Thyroid: No thyromegaly.     Trachea: No tracheal tenderness.     Comments: No cervical lymphadenopathy Cardiovascular:     Rate and Rhythm: Normal rate and regular rhythm.     Heart sounds: No murmur heard.   Pulmonary:     Effort: Pulmonary effort is normal.     Breath sounds: Normal breath sounds. No wheezing or rales.  Abdominal:     Tenderness: There is abdominal tenderness in the right upper quadrant. There is no guarding or rebound.     Hernia: No hernia is present.  Skin:    General: Skin is warm.     Findings: No rash.     Nails: There is no clubbing.     Comments: Normal skin turgor  Neurological:     Mental Status: She is alert and oriented to person, place, and time.     Comments: Normal gait and station  Psychiatric:        Judgment: Judgment normal.     Comments: Appropriate affect      Assessment/Plan 25 year old female with likely subacute cholecystitis, cholelithiasis  Patient has been given antibiotics in the ER. We will proceed to the operating room for laparoscopic cholecystectomy possible IOC.  I discussed with Dr. 25 will be performing this procedure.   Bedelia Person, MD 01/24/2021, 12:22 PM

## 2021-01-24 NOTE — Anesthesia Postprocedure Evaluation (Signed)
Anesthesia Post Note  Patient: Jasmine Sanchez  Procedure(s) Performed: LAPAROSCOPIC CHOLECYSTECTOMY (N/A Abdomen)     Patient location during evaluation: PACU Anesthesia Type: General Level of consciousness: awake and alert Pain management: pain level controlled Vital Signs Assessment: post-procedure vital signs reviewed and stable Respiratory status: spontaneous breathing, nonlabored ventilation, respiratory function stable and patient connected to nasal cannula oxygen Cardiovascular status: blood pressure returned to baseline and stable Postop Assessment: no apparent nausea or vomiting Anesthetic complications: no   No complications documented.  Last Vitals:  Vitals:   01/24/21 1843 01/24/21 1900  BP: 135/87 137/88  Pulse: 84 66  Resp: 15 14  Temp: 36.8 C   SpO2: 97% 98%    Last Pain:  Vitals:   01/24/21 1900  TempSrc:   PainSc: 0-No pain                 Cecile Hearing

## 2021-01-24 NOTE — Op Note (Signed)
   Operative Note  Date: 01/24/2021  Procedure: laparoscopic cholecystectomy  Pre-op diagnosis: symptomatic cholelithiasis Post-op diagnosis: same  Indication and clinical history: The patient is a 25 y.o. year old female with symptomatic cholelithiasis  Surgeon: Diamantina Monks, MD Assistant: Michaell Cowing, MD  Anesthesiologist: Desmond Lope, MD Anesthesia: General  Findings:  . Specimen: gallbladder . EBL: <5cc . Drains/Implants: none  Disposition: PACU - hemodynamically stable.  Description of procedure: The patient was positioned supine on the operating room table. Time-out was performed verifying correct patient, procedure, signature of informed consent, and administration of pre-operative antibiotics. General anesthetic induction and intubation were uneventful. The abdomen was prepped and draped in the usual sterile fashion. An infra-umbilical incision was made using an open technique using zero vicryl stay sutures on either side of the fascia and a 49mm Hassan port inserted. After establishing pneumoperitoneum, which the patient tolerated well, the abdominal cavity was inspected and no injury of any intra-abdominal structures was identified. Additional ports were placed under direct visualization and using local anesthetic: two 73mm ports in the right subcostal region and a 48mm port in the epigastric region. The patient was re-positioned to reverse Trendelenburg and right side up. The gallbladder was exposed and then retracted cephalad. The infundibulum was identified and retracted toward the right lower quadrant. The peritoneum was incised over the infundibulum and the triangle of Calot dissected to expose the critical view of safety. With clear identification and isolation of the cystic duct and cystic artery, the cystic artery was doubly clipped and divided. After this, the cystic duct was identified as a single structure entering the gallbladder, and was also doubly clipped and divided. The  gallbladder was dissected off the liver bed using electrocautery and hemostasis of the liver bed was confirmed prior to separation of the final peritoneal attachments of the gallbladder to the liver bed. After transection of the final peritoneal attachments, the gallbladder was placed in an endoscopic specimen retrieval bag, removed via the umbilical port site, and sent to pathology as a frozen specimen. The gallbladder fossa was inspected confirming hemostasis, the absence of bile leakage from the cystic duct stump, and correct placement of clips on the cystic artery and cystic duct stumps. The abdomen was desufflated and the fascia of the umbilical port site was closed using the previously placed stay sutures. Additional local anesthetic was administered at the umbilical port site.  The skin of all incisions was closed with 4-0 monocryl. Sterile dressings were applied. All sponge and instrument counts were correct at the conclusion of the procedure. The patient was awakened from anesthesia, extubated uneventfully, and transported to the PACU - hemodynamically stable.. There were no complications.    Diamantina Monks, MD General and Trauma Surgery [facility]

## 2021-01-24 NOTE — ED Triage Notes (Signed)
Pt reports that she has have been having gallbladder issues since February of 2021. Reports having upper right abdominal pain. Pain started at 10pm. Reports vomiting x 3 and fever of 101 at home. Did not take any medications at home. Denies diarrhea.

## 2021-01-24 NOTE — Anesthesia Preprocedure Evaluation (Addendum)
Anesthesia Evaluation  Patient identified by MRN, date of birth, ID band Patient awake    Reviewed: Allergy & Precautions, NPO status , Patient's Chart, lab work & pertinent test results  Airway Mallampati: II  TM Distance: >3 FB Neck ROM: Full    Dental  (+) Teeth Intact, Dental Advisory Given   Pulmonary former smoker,    Pulmonary exam normal breath sounds clear to auscultation       Cardiovascular negative cardio ROS Normal cardiovascular exam Rhythm:Regular Rate:Normal     Neuro/Psych negative neurological ROS  negative psych ROS   GI/Hepatic negative GI ROS, CHOLECYSTITIS   Endo/Other  Morbid obesity  Renal/GU negative Renal ROS     Musculoskeletal negative musculoskeletal ROS (+)   Abdominal   Peds  Hematology negative hematology ROS (+)   Anesthesia Other Findings Day of surgery medications reviewed with the patient.  Reproductive/Obstetrics                            Anesthesia Physical Anesthesia Plan  ASA: II  Anesthesia Plan: General   Post-op Pain Management:    Induction: Intravenous  PONV Risk Score and Plan: 4 or greater and Midazolam, Dexamethasone, Ondansetron and Scopolamine patch - Pre-op  Airway Management Planned: Oral ETT  Additional Equipment:   Intra-op Plan:   Post-operative Plan: Extubation in OR  Informed Consent: I have reviewed the patients History and Physical, chart, labs and discussed the procedure including the risks, benefits and alternatives for the proposed anesthesia with the patient or authorized representative who has indicated his/her understanding and acceptance.     Dental advisory given  Plan Discussed with: CRNA  Anesthesia Plan Comments:        Anesthesia Quick Evaluation

## 2021-01-24 NOTE — Anesthesia Procedure Notes (Signed)
Procedure Name: Intubation Date/Time: 01/24/2021 4:46 PM Performed by: Lynnell Chad, CRNA Pre-anesthesia Checklist: Patient identified, Emergency Drugs available, Suction available and Patient being monitored Patient Re-evaluated:Patient Re-evaluated prior to induction Oxygen Delivery Method: Circle System Utilized Preoxygenation: Pre-oxygenation with 100% oxygen Induction Type: IV induction Ventilation: Mask ventilation without difficulty Laryngoscope Size: Miller and 2 Grade View: Grade I Tube type: Oral Tube size: 7.0 mm Number of attempts: 1 Airway Equipment and Method: Stylet and Oral airway Placement Confirmation: ETT inserted through vocal cords under direct vision,  positive ETCO2 and breath sounds checked- equal and bilateral Secured at: 22 cm Tube secured with: Tape Dental Injury: Teeth and Oropharynx as per pre-operative assessment

## 2021-01-24 NOTE — Transfer of Care (Signed)
Immediate Anesthesia Transfer of Care Note  Patient: Jasmine Sanchez  Procedure(s) Performed: LAPAROSCOPIC CHOLECYSTECTOMY (N/A Abdomen)  Patient Location: PACU  Anesthesia Type:General  Level of Consciousness: drowsy and patient cooperative  Airway & Oxygen Therapy: Patient Spontanous Breathing  Post-op Assessment: Report given to RN and Post -op Vital signs reviewed and stable  Post vital signs: Reviewed and stable  Last Vitals:  Vitals Value Taken Time  BP 148/98 01/24/21 1813  Temp 36.6 C 01/24/21 1813  Pulse 82 01/24/21 1818  Resp 21 01/24/21 1818  SpO2 90 % 01/24/21 1818  Vitals shown include unvalidated device data.  Last Pain:  Vitals:   01/24/21 1813  TempSrc:   PainSc: Asleep         Complications: No complications documented.

## 2021-01-25 ENCOUNTER — Encounter (HOSPITAL_COMMUNITY): Payer: Self-pay | Admitting: Surgery

## 2021-01-27 LAB — SURGICAL PATHOLOGY

## 2021-01-27 LAB — URINE CULTURE: Culture: >=100000 — AB

## 2021-03-08 ENCOUNTER — Emergency Department: Payer: Self-pay

## 2021-03-08 ENCOUNTER — Inpatient Hospital Stay
Admission: EM | Admit: 2021-03-08 | Discharge: 2021-03-10 | DRG: 439 | Disposition: A | Discharge status: Home / Self Care | Payer: Self-pay | Attending: Internal Medicine | Admitting: Internal Medicine

## 2021-03-08 ENCOUNTER — Inpatient Hospital Stay: Payer: Self-pay

## 2021-03-08 ENCOUNTER — Other Ambulatory Visit: Payer: Self-pay

## 2021-03-08 ENCOUNTER — Encounter: Payer: Self-pay | Admitting: Emergency Medicine

## 2021-03-08 DIAGNOSIS — Z91014 Allergy to mammalian meats: Secondary | ICD-10-CM

## 2021-03-08 DIAGNOSIS — Z6841 Body Mass Index (BMI) 40.0 and over, adult: Secondary | ICD-10-CM

## 2021-03-08 DIAGNOSIS — K859 Acute pancreatitis without necrosis or infection, unspecified: Secondary | ICD-10-CM

## 2021-03-08 DIAGNOSIS — K8050 Calculus of bile duct without cholangitis or cholecystitis without obstruction: Secondary | ICD-10-CM

## 2021-03-08 DIAGNOSIS — G43909 Migraine, unspecified, not intractable, without status migrainosus: Secondary | ICD-10-CM | POA: Diagnosis present

## 2021-03-08 DIAGNOSIS — Z87891 Personal history of nicotine dependence: Secondary | ICD-10-CM

## 2021-03-08 DIAGNOSIS — R7989 Other specified abnormal findings of blood chemistry: Secondary | ICD-10-CM | POA: Diagnosis present

## 2021-03-08 DIAGNOSIS — R109 Unspecified abdominal pain: Secondary | ICD-10-CM

## 2021-03-08 DIAGNOSIS — R748 Abnormal levels of other serum enzymes: Secondary | ICD-10-CM | POA: Diagnosis present

## 2021-03-08 DIAGNOSIS — E876 Hypokalemia: Secondary | ICD-10-CM | POA: Diagnosis present

## 2021-03-08 DIAGNOSIS — I10 Essential (primary) hypertension: Secondary | ICD-10-CM | POA: Diagnosis present

## 2021-03-08 DIAGNOSIS — Z9049 Acquired absence of other specified parts of digestive tract: Secondary | ICD-10-CM

## 2021-03-08 DIAGNOSIS — K8031 Calculus of bile duct with cholangitis, unspecified, with obstruction: Secondary | ICD-10-CM | POA: Diagnosis present

## 2021-03-08 DIAGNOSIS — K851 Biliary acute pancreatitis without necrosis or infection: Principal | ICD-10-CM | POA: Diagnosis present

## 2021-03-08 DIAGNOSIS — R945 Abnormal results of liver function studies: Secondary | ICD-10-CM | POA: Diagnosis present

## 2021-03-08 DIAGNOSIS — Z880 Allergy status to penicillin: Secondary | ICD-10-CM

## 2021-03-08 DIAGNOSIS — K858 Other acute pancreatitis without necrosis or infection: Secondary | ICD-10-CM

## 2021-03-08 DIAGNOSIS — D72829 Elevated white blood cell count, unspecified: Secondary | ICD-10-CM | POA: Diagnosis present

## 2021-03-08 DIAGNOSIS — Z20822 Contact with and (suspected) exposure to covid-19: Secondary | ICD-10-CM | POA: Diagnosis present

## 2021-03-08 HISTORY — DX: Essential (primary) hypertension: I10

## 2021-03-08 LAB — CBC WITH DIFFERENTIAL/PLATELET
Abs Immature Granulocytes: 0.03 10*3/uL (ref 0.00–0.07)
Basophils Absolute: 0 10*3/uL (ref 0.0–0.1)
Basophils Relative: 0 %
Eosinophils Absolute: 0.1 10*3/uL (ref 0.0–0.5)
Eosinophils Relative: 1 %
HCT: 38 % (ref 36.0–46.0)
Hemoglobin: 12.9 g/dL (ref 12.0–15.0)
Immature Granulocytes: 0 %
Lymphocytes Relative: 12 %
Lymphs Abs: 1.4 10*3/uL (ref 0.7–4.0)
MCH: 30.3 pg (ref 26.0–34.0)
MCHC: 33.9 g/dL (ref 30.0–36.0)
MCV: 89.2 fL (ref 80.0–100.0)
Monocytes Absolute: 0.9 10*3/uL (ref 0.1–1.0)
Monocytes Relative: 8 %
Neutro Abs: 8.9 10*3/uL — ABNORMAL HIGH (ref 1.7–7.7)
Neutrophils Relative %: 79 %
Platelets: 316 10*3/uL (ref 150–400)
RBC: 4.26 MIL/uL (ref 3.87–5.11)
RDW: 13.8 % (ref 11.5–15.5)
WBC: 11.3 10*3/uL — ABNORMAL HIGH (ref 4.0–10.5)
nRBC: 0 % (ref 0.0–0.2)

## 2021-03-08 LAB — COMPREHENSIVE METABOLIC PANEL
ALT: 343 U/L — ABNORMAL HIGH (ref 0–44)
AST: 274 U/L — ABNORMAL HIGH (ref 15–41)
Albumin: 3.9 g/dL (ref 3.5–5.0)
Alkaline Phosphatase: 695 U/L — ABNORMAL HIGH (ref 38–126)
Anion gap: 11 (ref 5–15)
BUN: 5 mg/dL — ABNORMAL LOW (ref 6–20)
CO2: 20 mmol/L — ABNORMAL LOW (ref 22–32)
Calcium: 8.9 mg/dL (ref 8.9–10.3)
Chloride: 106 mmol/L (ref 98–111)
Creatinine, Ser: 0.53 mg/dL (ref 0.44–1.00)
GFR, Estimated: >60 mL/min (ref >60)
Glucose, Bld: 107 mg/dL — ABNORMAL HIGH (ref 70–99)
Potassium: 4.1 mmol/L (ref 3.5–5.1)
Sodium: 137 mmol/L (ref 135–145)
Total Bilirubin: 5 mg/dL — ABNORMAL HIGH (ref 0.3–1.2)
Total Protein: 7.9 g/dL (ref 6.5–8.1)

## 2021-03-08 LAB — HEPATITIS PANEL, ACUTE
HCV Ab: NONREACTIVE
Hep A IgM: NONREACTIVE
Hep B C IgM: NONREACTIVE
Hepatitis B Surface Ag: NONREACTIVE

## 2021-03-08 LAB — LIPASE, BLOOD: Lipase: 4289 U/L — ABNORMAL HIGH (ref 11–51)

## 2021-03-08 LAB — URINALYSIS, COMPLETE (UACMP) WITH MICROSCOPIC
Bilirubin Urine: NEGATIVE
Glucose, UA: NEGATIVE mg/dL
Hgb urine dipstick: NEGATIVE
Ketones, ur: NEGATIVE mg/dL
Nitrite: NEGATIVE
Protein, ur: NEGATIVE mg/dL
Specific Gravity, Urine: 1.004 — ABNORMAL LOW (ref 1.005–1.030)
pH: 5 (ref 5.0–8.0)

## 2021-03-08 LAB — APTT: aPTT: 28 seconds (ref 24–36)

## 2021-03-08 LAB — TROPONIN I (HIGH SENSITIVITY): Troponin I (High Sensitivity): 4 ng/L (ref <18)

## 2021-03-08 LAB — D-DIMER, QUANTITATIVE: D-Dimer, Quant: 1.34 ug/mL-FEU — ABNORMAL HIGH (ref 0.00–0.50)

## 2021-03-08 LAB — HIV ANTIBODY (ROUTINE TESTING W REFLEX): HIV Screen 4th Generation wRfx: NONREACTIVE

## 2021-03-08 LAB — PROTIME-INR
INR: 1 (ref 0.8–1.2)
Prothrombin Time: 13.2 seconds (ref 11.4–15.2)

## 2021-03-08 LAB — RESP PANEL BY RT-PCR (FLU A&B, COVID) ARPGX2
Influenza A by PCR: NEGATIVE
Influenza B by PCR: NEGATIVE
SARS Coronavirus 2 by RT PCR: NEGATIVE

## 2021-03-08 LAB — POC URINE PREG, ED: Preg Test, Ur: NEGATIVE

## 2021-03-08 LAB — TRIGLYCERIDES: Triglycerides: 79 mg/dL (ref <150)

## 2021-03-08 LAB — ACETAMINOPHEN LEVEL: Acetaminophen (Tylenol), Serum: 12 ug/mL (ref 10–30)

## 2021-03-08 LAB — GLUCOSE, CAPILLARY: Glucose-Capillary: 74 mg/dL (ref 70–99)

## 2021-03-08 MED ORDER — HYDROCODONE-ACETAMINOPHEN 5-325 MG PO TABS
1.0000 | ORAL_TABLET | Freq: Once | ORAL | Status: AC
  Administered 2021-03-08: 1 via ORAL
  Filled 2021-03-08: qty 1

## 2021-03-08 MED ORDER — IOHEXOL 350 MG/ML SOLN
75.0000 mL | Freq: Once | INTRAVENOUS | Status: AC | PRN
  Administered 2021-03-08: 75 mL via INTRAVENOUS
  Filled 2021-03-08: qty 75

## 2021-03-08 MED ORDER — GADOBUTROL 1 MMOL/ML IV SOLN: 7.5000 mL | Freq: Once | INTRAVENOUS | Status: DC | PRN

## 2021-03-08 MED ORDER — KETOROLAC TROMETHAMINE 30 MG/ML IJ SOLN
15.0000 mg | INTRAMUSCULAR | Status: AC
  Administered 2021-03-08: 15 mg via INTRAVENOUS
  Filled 2021-03-08: qty 1

## 2021-03-08 MED ORDER — MORPHINE SULFATE (PF) 2 MG/ML IV SOLN
2.0000 mg | INTRAVENOUS | Status: DC | PRN
Start: 2021-03-08 — End: 2021-03-10

## 2021-03-08 MED ORDER — HEPARIN SODIUM (PORCINE) 5000 UNIT/ML IJ SOLN
5000.0000 [IU] | Freq: Three times a day (TID) | INTRAMUSCULAR | Status: DC
  Filled 2021-03-08 (×2): qty 1

## 2021-03-08 MED ORDER — HYDRALAZINE HCL 20 MG/ML IJ SOLN: 5.0000 mg | INTRAMUSCULAR | Status: DC | PRN

## 2021-03-08 MED ORDER — ONDANSETRON HCL 4 MG/2ML IJ SOLN: 4.0000 mg | Freq: Three times a day (TID) | INTRAMUSCULAR | Status: DC | PRN

## 2021-03-08 MED ORDER — IBUPROFEN 400 MG PO TABS: 400.0000 mg | ORAL_TABLET | Freq: Three times a day (TID) | ORAL | Status: DC | PRN

## 2021-03-08 MED ORDER — OXYCODONE HCL 5 MG PO TABS
5.0000 mg | ORAL_TABLET | Freq: Four times a day (QID) | ORAL | Status: DC | PRN
Start: 2021-03-08 — End: 2021-03-10

## 2021-03-08 MED ORDER — LACTATED RINGERS IV SOLN: INTRAVENOUS | Status: DC

## 2021-03-08 MED ORDER — GADOBUTROL 1 MMOL/ML IV SOLN
10.0000 mL | Freq: Once | INTRAVENOUS | Status: AC | PRN
  Administered 2021-03-08: 10 mL via INTRAVENOUS

## 2021-03-08 NOTE — ED Triage Notes (Signed)
Pt reports that she developed Shortness of breath, back pain and right shoulder pain that began hurting at 11 am yesterday. Pt is able to speak in complete clear sentences.

## 2021-03-08 NOTE — ED Triage Notes (Signed)
Pts shoulder pain is reproducible with palpation and and movement of her neck. Pt states that she just went back to work on Tuesday after being off for having her GB out.

## 2021-03-08 NOTE — ED Notes (Signed)
Patient reports her pain is much better and now and she can lay on her back

## 2021-03-08 NOTE — H&P (Addendum)
History and Physical    Jasmine Sanchez DTO:[id number] DOB: Jul 20, 1996 DOA: 03/08/2021  Referring MD/NP/PA:   PCP: Patient, No Pcp Per (Inactive)   Patient coming from:  The patient is coming from home.  At baseline, pt is independent for most of ADL.        Chief Complaint: Abdominal pain  HPI: Jasmine Sanchez is a 25 y.o. female with medical history significant of hypertension, former smoker, migraine headache, s/p for cholecystectomy 01/24/2021, who presents with abdominal pain.  Patient states that her abdominal pain started yesterday morning, which is located in the epigastric area, aching, 8 out of 10 severity, radiating to the back and right shoulder.  Denies nausea, vomiting or diarrhea.  No fever or chills.  Patient does not have chest pain, cough, shortness breath.  No symptoms of UTI.  Patient denies drinking alcohol.  ED Course: pt was found to have lipase 40-89, abnormal liver function (ALP 695, AST 274, ALT 343, total bilirubin 5.0), troponin level 4, negative pregnancy test, pending COVID-19 PCR, D-dimer 1.34, urinalysis with small amount of leukocyte and rare bacteria otherwise not impressive.  Temperature normal, blood pressure 128/82, heart rate 95, 72, RR 20, oxygen saturation 98% on room air.  Chest x-ray negative.  CT angiogram of chest is negative for PE.  CT abdomen/pelvis showed pancreatitis and mild intra and extra hepatic biliary ductal dilation.  Patient is admitted to MedSurg bed as inpatient.  GI, Dr. Tobi Bastos is consulted.  CT-abd-pelvis: 1. Very subtle peripancreatic fat stranding adjacent to the pancreatic head and proximal body may represent early acute pancreatitis. Correlation with serum lipase levels is recommended. 2. Status post cholecystectomy. No fluid collections within the gallbladder fossa. 3. Mild intra and extrahepatic biliary ductal dilatation, which may be related to post cholecystectomy changes.  Review of Systems:   General: no fevers, chills,  no body weight gain, has poor appetite, has fatigue HEENT: no blurry vision, hearing changes or sore throat Respiratory: no dyspnea, coughing, wheezing CV: no chest pain, no palpitations GI: no nausea, vomiting, has abdominal pain, no diarrhea, constipation GU: no dysuria, burning on urination, increased urinary frequency, hematuria  Ext: no leg edema Neuro: no unilateral weakness, numbness, or tingling, no vision change or hearing loss Skin: no rash, no skin tear. MSK: No muscle spasm, no deformity, no limitation of range of movement in spin Heme: No easy bruising.  Travel history: No recent long distant travel.  Allergy:  Allergies  Allergen Reactions  . Amoxil [Amoxicillin] Anaphylaxis  . Penicillins Anaphylaxis  . Latex     rash    Past Medical History:  Diagnosis Date  . Hypertension   . Known health problems: none     Past Surgical History:  Procedure Laterality Date  . CHOLECYSTECTOMY N/A 01/24/2021   Procedure: LAPAROSCOPIC CHOLECYSTECTOMY;  Surgeon: Diamantina Monks, MD;  Location: [facility] OR;  Service: General;  Laterality: N/A;  . TONSILLECTOMY      Social History:  reports that she has quit smoking. Her smoking use included cigarettes. She smoked 0.20 packs per day. She has never used smokeless tobacco. She reports current alcohol use. She reports that she does not use drugs.  Family History:  Family History  Problem Relation Age of Onset  . Ulcerative colitis Father   . Ulcerative colitis Sister      Prior to Admission medications   Medication Sig Start Date End Date Taking? Authorizing Provider  docusate sodium (COLACE) 100 MG capsule Take 1 tablet once or twice daily  as needed for constipation while taking narcotic pain medicine 11/22/20   Loleta Rose, MD  ibuprofen (ADVIL) 800 MG tablet Take 1 tablet (800 mg total) by mouth every 8 (eight) hours as needed. 01/24/21   Diamantina Monks, MD  ondansetron (ZOFRAN ODT) 4 MG disintegrating tablet Allow 1-2 tablets to  dissolve in your mouth every 8 hours as needed for nausea/vomiting 11/22/20   Loleta Rose, MD  oxyCODONE (ROXICODONE) 5 MG immediate release tablet Take 1 tablet (5 mg total) by mouth every 4 (four) hours as needed for breakthrough pain. 01/24/21   Diamantina Monks, MD    Physical Exam: Vitals:   03/08/21 5284 03/08/21 0644 03/08/21 1048  BP: (!) 138/109  128/82  Pulse: 95  72  Resp: 20  20  Temp: 98.4 F (36.9 C)    TempSrc: Oral    SpO2: 98%  98%  Weight:  134.7 kg   Height:  5' 9.5" (1.765 m)    General: Not in acute distress HEENT:       Eyes: PERRL, EOMI, no scleral icterus.       ENT: No discharge from the ears and nose, no pharynx injection, no tonsillar enlargement.        Neck: No JVD, no bruit, no mass felt. Heme: No neck lymph node enlargement. Cardiac: S1/S2, RRR, No murmurs, No gallops or rubs. Respiratory: No rales, wheezing, rhonchi or rubs. GI: Soft, nondistended, has tenderness in epigastric area, no rebound pain, no organomegaly, BS present. GU: No hematuria Ext: No pitting leg edema bilaterally. 1+DP/PT pulse bilaterally. Musculoskeletal: No joint deformities, No joint redness or warmth, no limitation of ROM in spin. Skin: No rashes.  Neuro: Alert, oriented X3, cranial nerves II-XII grossly intact, moves all extremities normally.  Psych: Patient is not psychotic, no suicidal or hemocidal ideation.  Labs on Admission: I have personally reviewed following labs and imaging studies  CBC: Recent Labs  Lab 03/08/21 0800  WBC 11.3*  NEUTROABS 8.9*  HGB 12.9  HCT 38.0  MCV 89.2  PLT 316   Basic Metabolic Panel: Recent Labs  Lab 03/08/21 0800  NA 137  K 4.1  CL 106  CO2 20*  GLUCOSE 107*  BUN 5*  CREATININE 0.53  CALCIUM 8.9   GFR: Estimated Creatinine Clearance: 160 mL/min (by C-G formula based on SCr of 0.53 mg/dL). Liver Function Tests: Recent Labs  Lab 03/08/21 0800  AST 274*  ALT 343*  ALKPHOS 695*  BILITOT 5.0*  PROT 7.9  ALBUMIN 3.9    Recent Labs  Lab 03/08/21 0800  LIPASE 4,289*   No results for input(s): AMMONIA in the last 168 hours. Coagulation Profile: No results for input(s): INR, PROTIME in the last 168 hours. Cardiac Enzymes: No results for input(s): CKTOTAL, CKMB, CKMBINDEX, TROPONINI in the last 168 hours. BNP (last 3 results) No results for input(s): PROBNP in the last 8760 hours. HbA1C: No results for input(s): HGBA1C in the last 72 hours. CBG: No results for input(s): GLUCAP in the last 168 hours. Lipid Profile: No results for input(s): CHOL, HDL, LDLCALC, TRIG, CHOLHDL, LDLDIRECT in the last 72 hours. Thyroid Function Tests: No results for input(s): TSH, T4TOTAL, FREET4, T3FREE, THYROIDAB in the last 72 hours. Anemia Panel: No results for input(s): VITAMINB12, FOLATE, FERRITIN, TIBC, IRON, RETICCTPCT in the last 72 hours. Urine analysis:    Component Value Date/Time   COLORURINE AMBER (A) 03/08/2021 0800   APPEARANCEUR CLEAR (A) 03/08/2021 0800   LABSPEC 1.004 (L) 03/08/2021 0800  PHURINE 5.0 03/08/2021 0800   GLUCOSEU NEGATIVE 03/08/2021 0800   HGBUR NEGATIVE 03/08/2021 0800   BILIRUBINUR NEGATIVE 03/08/2021 0800   KETONESUR NEGATIVE 03/08/2021 0800   PROTEINUR NEGATIVE 03/08/2021 0800   NITRITE NEGATIVE 03/08/2021 0800   LEUKOCYTESUR SMALL (A) 03/08/2021 0800   Sepsis Labs: (procalcitonin:4,lacticidven:4) ) Recent Results (from the past 240 hour(s))  Resp Panel by RT-PCR (Flu A&B, Covid) Nasopharyngeal Swab     Status: None   Collection Time: 03/08/21  3:06 PM   Specimen: Nasopharyngeal Swab; Nasopharyngeal(NP) swabs in vial transport medium  Result Value Ref Range Status   SARS Coronavirus 2 by RT PCR NEGATIVE NEGATIVE Final    Comment: (NOTE) SARS-CoV-2 target nucleic acids are NOT DETECTED.  The SARS-CoV-2 RNA is generally detectable in upper respiratory specimens during the acute phase of infection. The lowest concentration of SARS-CoV-2 viral copies this assay  can detect is 138 copies/mL. A negative result does not preclude SARS-Cov-2 infection and should not be used as the sole basis for treatment or other patient management decisions. A negative result may occur with  improper specimen collection/handling, submission of specimen other than nasopharyngeal swab, presence of viral mutation(s) within the areas targeted by this assay, and inadequate number of viral copies(<138 copies/mL). A negative result must be combined with clinical observations, patient history, and epidemiological information. The expected result is Negative.  Fact Sheet for Patients:  BloggerCourse.com  Fact Sheet for Healthcare Providers:  SeriousBroker.it  This test is no t yet approved or cleared by the [place] FDA and  has been authorized for detection and/or diagnosis of SARS-CoV-2 by FDA under an Emergency Use Authorization (EUA). This EUA will remain  in effect (meaning this test can be used) for the duration of the COVID-19 declaration under Section 564(b)(1) of the Act, 21 U.S.C.section 360bbb-3(b)(1), unless the authorization is terminated  or revoked sooner.       Influenza A by PCR NEGATIVE NEGATIVE Final   Influenza B by PCR NEGATIVE NEGATIVE Final    Comment: (NOTE) The Xpert Xpress SARS-CoV-2/FLU/RSV plus assay is intended as an aid in the diagnosis of influenza from Nasopharyngeal swab specimens and should not be used as a sole basis for treatment. Nasal washings and aspirates are unacceptable for Xpert Xpress SARS-CoV-2/FLU/RSV testing.  Fact Sheet for Patients: BloggerCourse.com  Fact Sheet for Healthcare Providers: SeriousBroker.it  This test is not yet approved or cleared by the [place] FDA and has been authorized for detection and/or diagnosis of SARS-CoV-2 by FDA under an Emergency Use Authorization (EUA). This EUA will  remain in effect (meaning this test can be used) for the duration of the COVID-19 declaration under Section 564(b)(1) of the Act, 21 U.S.C. section 360bbb-3(b)(1), unless the authorization is terminated or revoked.  Performed at [facility], [address] [place], [place] [address]      Radiological Exams on Admission: CT ABDOMEN PELVIS WO CONTRAST  Result Date: 03/08/2021 CLINICAL DATA:  Upper abdominal pain. History of cholecystectomy on 01/24/2021 EXAM: CT ABDOMEN AND PELVIS WITHOUT CONTRAST TECHNIQUE: Multidetector CT imaging of the abdomen and pelvis was performed following the standard protocol without IV contrast. COMPARISON:  None. FINDINGS: Lower chest: Included lung bases are clear.  Heart size is normal. Hepatobiliary: Status post cholecystectomy. No fluid collections within the gallbladder fossa. Mild intra and extrahepatic biliary ductal dilatation is seen, which may be related to post cholecystectomy changes. No hyperdense stone is seen within the common bile duct. Liver has an otherwise unremarkable unenhanced appearance.  No focal liver lesion identified. Pancreas: Very subtle peripancreatic fat stranding adjacent to the pancreatic head and proximal body (series 2, image 30). No peripancreatic fluid or fluid collections. No pancreatic ductal dilatation. Spleen: Normal in size without focal abnormality. Adrenals/Urinary Tract: Unremarkable adrenal glands. Excreted contrast is present within the bilateral renal collecting systems and urinary bladder. Kidneys have an unremarkable unenhanced appearance. No evidence of renal lesion, stone, or hydronephrosis. No urinary bladder wall thickening or filling defects. Stomach/Bowel: Stomach is within normal limits. Appendix appears normal (series 2, image 64). No evidence of bowel wall thickening, distention, or inflammatory changes. Vascular/Lymphatic: No significant vascular findings are present. No enlarged abdominal or pelvic lymph  nodes. Reproductive: Uterus and bilateral adnexa are unremarkable. Other: No free fluid. No abdominopelvic fluid collection. No pneumoperitoneum. No abdominal wall hernia. Musculoskeletal: No acute or significant osseous findings. IMPRESSION: 1. Very subtle peripancreatic fat stranding adjacent to the pancreatic head and proximal body may represent early acute pancreatitis. Correlation with serum lipase levels is recommended. 2. Status post cholecystectomy. No fluid collections within the gallbladder fossa. 3. Mild intra and extrahepatic biliary ductal dilatation, which may be related to post cholecystectomy changes. Electronically Signed   By: Duanne Guess D.O.   On: 03/08/2021 12:32   DG Chest 2 View  Result Date: 03/08/2021 CLINICAL DATA:  25 year old female with shortness of breath and back pain since yesterday. EXAM: CHEST - 2 VIEW COMPARISON:  Chest radiographs 11/21/2020. FINDINGS: Normal lung volumes with stable mild elevation of the right hemidiaphragm, normal variant. Normal cardiac size and mediastinal contours. Visualized tracheal air column is within normal limits. Both lungs appear clear today. No pneumothorax or pleural effusion. No osseous abnormality identified. Cholecystectomy clips are new since February. Negative visible bowel gas pattern. IMPRESSION: 1. Negative chest.  No cardiopulmonary abnormality. 2. Interval cholecystectomy. Electronically Signed   By: Odessa Fleming M.D.   On: 03/08/2021 07:22   CT Angio Chest PE W and/or Wo Contrast  Result Date: 03/08/2021 CLINICAL DATA:  Chest pain.  Rule out pulmonary embolus. EXAM: CT ANGIOGRAPHY CHEST WITH CONTRAST TECHNIQUE: Multidetector CT imaging of the chest was performed using the standard protocol during bolus administration of intravenous contrast. Multiplanar CT image reconstructions and MIPs were obtained to evaluate the vascular anatomy. CONTRAST:  72mL OMNIPAQUE IOHEXOL 350 MG/ML SOLN COMPARISON:  Chest x-ray Mar 08, 2021 FINDINGS:  Cardiovascular: Satisfactory opacification of the pulmonary arteries to the segmental level. No evidence of pulmonary embolism. Normal heart size. No pericardial effusion. Mediastinum/Nodes: No enlarged mediastinal, hilar, or axillary lymph nodes. Thyroid gland, trachea, and esophagus demonstrate no significant findings. Lungs/Pleura: Central airways are normal. No pneumothorax. Scattered subsegmental atelectasis. No suspicious nodules, masses, or focal infiltrates. Upper Abdomen: The patient is status post cholecystectomy. Intra and extrahepatic biliary duct dilatation identified. No other acute abnormalities are identified. Musculoskeletal: No chest wall abnormality. No acute or significant osseous findings. Review of the MIP images confirms the above findings. IMPRESSION: 1. No pulmonary emboli identified. 2. Mild intra and extrahepatic biliary duct prominence/dilatation is identified. The significance of this finding is unclear given cholecystectomy on January 24, 2021. Recommend correlation with labs/bilirubin levels. If there is concern for obstruction, MRCP or ERCP could better evaluate. Electronically Signed   By: Gerome Sam III M.D   On: 03/08/2021 10:39     EKG:  Not done in ED, will get one.   Assessment/Plan Principal Problem:   Acute pancreatitis Active Problems:   Abnormal LFTs   Hypertension   Leukocytosis  Positive D dimer   Acute pancreatiti, abnormal LFTs and hyperbilirubinemia: CT scan showed intra and extrahepatic biliary ductal dilation, suspecting common biliary duct obstruction.  Dr. Tobi BastosAnna of GI is consulted by ED physician.  Per ED physician, Dr. Tobi BastosAnna recommended MRCP, if it shows stone, will consult Dr. Servando SnareWohl for possible ERCP on Monday.  Patient has mild leukocytosis, but no fever.  Clinically not septic. Will hold off antibiotics now.  If patient develops fever, we will start antibiotics.  -Admitted to MedSurg bed as inpatient -IV fluid: LR at 200  cc/h -N.p.o. -MRCP -As needed morphine for pain and Zofran for nausea -check triglyceride level, hepatitis panel, Tylenol level  Hypertension: Blood pressure 128/82.  Patient not taking medications currently -IV hydralazine as needed  Leukocytosis: WBC 11.3 -Follow-up blood culture and UA  Positive D dimer -Follow-up lower extremity Doppler to rule out DVT   DVT ppx: SQ Heparin     Code Status: Full code Family Communication: not done, no family member is at bed side.  Disposition Plan:  Anticipate discharge back to previous environment Consults called:  Dr. Tobi BastosAnna of GI Admission status and Level of care: Med-Surg:   as inpt    Status is: Inpatient  Remains inpatient appropriate because:Inpatient level of care appropriate due to severity of illness   Dispo: The patient is from: Home              Anticipated d/c is to: Home              Patient currently is not medically stable to d/c.   Difficult to place patient No         Date of Service 03/08/2021    Lorretta HarpXilin Prerna Harold [facility]   If 7PM-7AM, please contact night-coverage [email or web address] 03/08/2021, 4:12 PM

## 2021-03-08 NOTE — Consult Note (Signed)
Wyline Mood , MD [address], [place], [place], [address] [address], [address], [place], [place], [address] Phone: [phone]  Fax: [phone]  Consultation  Referring Provider:    ER Primary Care Physician:  Patient, No Pcp Per (Inactive) Primary Gastroenterologist:  None          Reason for Consultation:     Acute pancreatitis  Date of Admission:  03/08/2021 Date of Consultation:  03/08/2021         HPI:   Jasmine Sanchez is a 25 y.o. female Underwent a laparoscopic cholecystectomy on 01/24/2021.  I was consulted by the ER for acute pancreatitis.  She, in the emergency room underwent a CT scan of the abdomen and pelvis with out contrast that demonstrated subtle peripancreatic fat stranding adjacent to the pancreatic head and proximal body representing early acute pancreatitis.  Mild intra and extrahepatic biliary dilation.  Hemoglobin 12.9 g, lipase 4000, total bilirubin 5.0 with an alkaline phosphatase of 695, AST 274 and ALT 343.     She presented to the emergency room with shortness of breath and back pain.She also complains of severe epigastric pain radiating to her back for atleast 1 day similar I nature to the pain she had before her gall bladder wa taken out. Denies any new meds that was started, or OTC or any herbal supplements. No family history of acute pancreatitis. No similar issue I the past   Past Medical History:  Diagnosis Date  . Hypertension   . Known health problems: none     Past Surgical History:  Procedure Laterality Date  . CHOLECYSTECTOMY N/A 01/24/2021   Procedure: LAPAROSCOPIC CHOLECYSTECTOMY;  Surgeon: Diamantina Monks, MD;  Location: [facility] OR;  Service: General;  Laterality: N/A;  . TONSILLECTOMY      Prior to Admission medications   Medication Sig Start Date End Date Taking? Authorizing Provider  ibuprofen (ADVIL) 800 MG tablet Take 1 tablet (800 mg total) by mouth every 8 (eight) hours as needed. 01/24/21  Yes Diamantina Monks, MD   docusate sodium (COLACE) 100 MG capsule Take 1 tablet once or twice daily as needed for constipation while taking narcotic pain medicine Patient not taking: Reported on 03/08/2021 11/22/20   Loleta Rose, MD  ondansetron (ZOFRAN ODT) 4 MG disintegrating tablet Allow 1-2 tablets to dissolve in your mouth every 8 hours as needed for nausea/vomiting Patient not taking: Reported on 03/08/2021 11/22/20   Loleta Rose, MD  oxyCODONE (ROXICODONE) 5 MG immediate release tablet Take 1 tablet (5 mg total) by mouth every 4 (four) hours as needed for breakthrough pain. Patient not taking: Reported on 03/08/2021 01/24/21   Diamantina Monks, MD    No family history on file.   Social History   Tobacco Use  . Smoking status: Former Smoker    Packs/day: 0.20    Types: Cigarettes  . Smokeless tobacco: Never Used  Vaping Use  . Vaping Use: Never used  Substance Use Topics  . Alcohol use: Yes    Comment: occasional  . Drug use: Never    Allergies as of 03/08/2021 - Review Complete 03/08/2021  Allergen Reaction Noted  . Amoxil [amoxicillin] Anaphylaxis 03/08/2021  . Penicillins Anaphylaxis 11/21/2020  . Latex  01/24/2021    Review of Systems:    All systems reviewed and negative except where noted in HPI.   Physical Exam:  Vital signs in last 24 hours: Temp:  [98.4 F (36.9 C)] 98.4 F (36.9 C) (05/22 0643) Pulse  Rate:  [72-95] 72 (05/22 1048) Resp:  [20] 20 (05/22 1048) BP: (128-138)/(82-109) 128/82 (05/22 1048) SpO2:  [98 %] 98 % (05/22 1048) Weight:  [134.7 kg] 134.7 kg (05/22 0644)   General:   Pleasant, cooperative in NAD Head:  Normocephalic and atraumatic. Eyes:   No icterus.   Conjunctiva pink. PERRLA. Ears:  Normal auditory acuity. Neck:  Supple; no masses or thyroidomegaly Lungs: Respirations even and unlabored. Lungs clear to auscultation bilaterally.   No wheezes, crackles, or rhonchi.  Heart:  Regular rate and rhythm;  Without murmur, clicks, rubs or gallops Abdomen:  Soft,  nondistended, nontender. Normal bowel sounds. No appreciable masses or hepatomegaly.  No rebound or guarding.  Neurologic:  Alert and oriented x3;  grossly normal neurologically. Skin:  Intact without significant lesions or rashes. Cervical Nodes:  No significant cervical adenopathy. Psych:  Alert and cooperative. Normal affect.  LAB RESULTS: Recent Labs    03/08/21 0800  WBC 11.3*  HGB 12.9  HCT 38.0  PLT 316   BMET Recent Labs    03/08/21 0800  NA 137  K 4.1  CL 106  CO2 20*  GLUCOSE 107*  BUN 5*  CREATININE 0.53  CALCIUM 8.9   LFT Recent Labs    03/08/21 0800  PROT 7.9  ALBUMIN 3.9  AST 274*  ALT 343*  ALKPHOS 695*  BILITOT 5.0*   PT/INR No results for input(s): LABPROT, INR in the last 72 hours.  STUDIES: CT ABDOMEN PELVIS WO CONTRAST  Result Date: 03/08/2021 CLINICAL DATA:  Upper abdominal pain. History of cholecystectomy on 01/24/2021 EXAM: CT ABDOMEN AND PELVIS WITHOUT CONTRAST TECHNIQUE: Multidetector CT imaging of the abdomen and pelvis was performed following the standard protocol without IV contrast. COMPARISON:  None. FINDINGS: Lower chest: Included lung bases are clear.  Heart size is normal. Hepatobiliary: Status post cholecystectomy. No fluid collections within the gallbladder fossa. Mild intra and extrahepatic biliary ductal dilatation is seen, which may be related to post cholecystectomy changes. No hyperdense stone is seen within the common bile duct. Liver has an otherwise unremarkable unenhanced appearance. No focal liver lesion identified. Pancreas: Very subtle peripancreatic fat stranding adjacent to the pancreatic head and proximal body (series 2, image 30). No peripancreatic fluid or fluid collections. No pancreatic ductal dilatation. Spleen: Normal in size without focal abnormality. Adrenals/Urinary Tract: Unremarkable adrenal glands. Excreted contrast is present within the bilateral renal collecting systems and urinary bladder. Kidneys have an  unremarkable unenhanced appearance. No evidence of renal lesion, stone, or hydronephrosis. No urinary bladder wall thickening or filling defects. Stomach/Bowel: Stomach is within normal limits. Appendix appears normal (series 2, image 64). No evidence of bowel wall thickening, distention, or inflammatory changes. Vascular/Lymphatic: No significant vascular findings are present. No enlarged abdominal or pelvic lymph nodes. Reproductive: Uterus and bilateral adnexa are unremarkable. Other: No free fluid. No abdominopelvic fluid collection. No pneumoperitoneum. No abdominal wall hernia. Musculoskeletal: No acute or significant osseous findings. IMPRESSION: 1. Very subtle peripancreatic fat stranding adjacent to the pancreatic head and proximal body may represent early acute pancreatitis. Correlation with serum lipase levels is recommended. 2. Status post cholecystectomy. No fluid collections within the gallbladder fossa. 3. Mild intra and extrahepatic biliary ductal dilatation, which may be related to post cholecystectomy changes. Electronically Signed   By: Duanne Guess D.O.   On: 03/08/2021 12:32   DG Chest 2 View  Result Date: 03/08/2021 CLINICAL DATA:  25 year old female with shortness of breath and back pain since yesterday. EXAM: CHEST - 2  VIEW COMPARISON:  Chest radiographs 11/21/2020. FINDINGS: Normal lung volumes with stable mild elevation of the right hemidiaphragm, normal variant. Normal cardiac size and mediastinal contours. Visualized tracheal air column is within normal limits. Both lungs appear clear today. No pneumothorax or pleural effusion. No osseous abnormality identified. Cholecystectomy clips are new since February. Negative visible bowel gas pattern. IMPRESSION: 1. Negative chest.  No cardiopulmonary abnormality. 2. Interval cholecystectomy. Electronically Signed   By: Odessa Fleming M.D.   On: 03/08/2021 07:22   CT Angio Chest PE W and/or Wo Contrast  Result Date: 03/08/2021 CLINICAL DATA:   Chest pain.  Rule out pulmonary embolus. EXAM: CT ANGIOGRAPHY CHEST WITH CONTRAST TECHNIQUE: Multidetector CT imaging of the chest was performed using the standard protocol during bolus administration of intravenous contrast. Multiplanar CT image reconstructions and MIPs were obtained to evaluate the vascular anatomy. CONTRAST:  53mL OMNIPAQUE IOHEXOL 350 MG/ML SOLN COMPARISON:  Chest x-ray Mar 08, 2021 FINDINGS: Cardiovascular: Satisfactory opacification of the pulmonary arteries to the segmental level. No evidence of pulmonary embolism. Normal heart size. No pericardial effusion. Mediastinum/Nodes: No enlarged mediastinal, hilar, or axillary lymph nodes. Thyroid gland, trachea, and esophagus demonstrate no significant findings. Lungs/Pleura: Central airways are normal. No pneumothorax. Scattered subsegmental atelectasis. No suspicious nodules, masses, or focal infiltrates. Upper Abdomen: The patient is status post cholecystectomy. Intra and extrahepatic biliary duct dilatation identified. No other acute abnormalities are identified. Musculoskeletal: No chest wall abnormality. No acute or significant osseous findings. Review of the MIP images confirms the above findings. IMPRESSION: 1. No pulmonary emboli identified. 2. Mild intra and extrahepatic biliary duct prominence/dilatation is identified. The significance of this finding is unclear given cholecystectomy on January 24, 2021. Recommend correlation with labs/bilirubin levels. If there is concern for obstruction, MRCP or ERCP could better evaluate. Electronically Signed   By: Gerome Sam III M.D   On: 03/08/2021 10:39      Impression / Plan:   Jasmine Sanchez is a 25 y.o. y/o female who underwent a laparoscopic cholecystectomy on 01/24/2021 presented to the emergency room with shortness of breath and underwent a CT scan of the chest abdomen and pelvis that demonstrated features of pancreatic inflammation and dilated intrahepatic extrahepatic biliary ducts  although the size of the common bile duct was not mentioned.  Total bilirubin was 5.0 with elevated transaminases and serum lipase of over 4000.  These features are very concerning for a stone in the common bile duct.  Plan 1.  Suggest IV fluids Ringer lactate.  Suggest a few liters over the next 8 to 12 hours with appropriate monitoring.   2.  Obtain an MRCP, if there is difficulty obtaining an MRCP obtain a right upper quadrant ultrasound to determine the size of the common bile duct.  If the common bile duct is greater than 6 mm, with an elevated total bilirubin would require an ERCP tomorrow.  3.  Serial abdominal exam  4.  Check triglyceride level and IgG4 levels  5.  Keep n.p.o. from midnight for possible ERCP based on MRCP results.  If a stone in the common bile duct has been ruled out by imaging then we can commence on clear liquids as tolerated and gradually advance slowly.   Thank you for involving me in the care of this patient.      LOS: 0 days   Wyline Mood, MD  03/08/2021, 3:50 PM

## 2021-03-08 NOTE — ED Notes (Signed)
Patient c/o right neck/shoulder pain that radiates down to her hand. Also c/o "loss of breath" with exertion

## 2021-03-08 NOTE — ED Provider Notes (Signed)
[facility] Emergency Department Provider Note  ____________________________________________   Event Date/Time   First MD Initiated Contact with Patient 03/08/21 [phone]     (approximate)  I have reviewed the triage vital signs and the nursing notes.   HISTORY  Chief Complaint Shortness of Breath and Back Pain   HPI [name] is a 25 y.o. female presents to the ED with complaint of shortness of breath that began yesterday.  Patient states that she "lost her breath" with exertion.  She also complains of right neck pain and shoulder pain that radiates down to her hand.  Patient denies any cough, congestion, fever, chills, nausea, vomiting or diarrhea.  She states that she has not had the COVID-vaccine.  Patient smokes cigarettes and also has a history of a cholecystectomy  on April 9 of this year.  She also is concerned that there may be some contamination with her well water with possible E. coli.  Rates her pain as an 8 out of 10.         Past Medical History:  Diagnosis Date  . Hypertension   . Known health problems: none     Patient Active Problem List   Diagnosis Date Noted  . Abnormal LFTs 03/08/2021  . Acute pancreatitis 03/08/2021  . Hypertension   . Leukocytosis   . Positive D dimer     Past Surgical History:  Procedure Laterality Date  . CHOLECYSTECTOMY N/A 01/24/2021   Procedure: LAPAROSCOPIC CHOLECYSTECTOMY;  Surgeon: Diamantina Monks, MD;  Location: [facility] OR;  Service: General;  Laterality: N/A;  . TONSILLECTOMY      Prior to Admission medications   Medication Sig Start Date End Date Taking? Authorizing Provider  ibuprofen (ADVIL) 800 MG tablet Take 1 tablet (800 mg total) by mouth every 8 (eight) hours as needed. 01/24/21  Yes Diamantina Monks, MD  docusate sodium (COLACE) 100 MG capsule Take 1 tablet once or twice daily as needed for constipation while taking narcotic pain medicine Patient not taking: Reported on 03/08/2021 11/22/20    Loleta Rose, MD  ondansetron (ZOFRAN ODT) 4 MG disintegrating tablet Allow 1-2 tablets to dissolve in your mouth every 8 hours as needed for nausea/vomiting Patient not taking: Reported on 03/08/2021 11/22/20   Loleta Rose, MD  oxyCODONE (ROXICODONE) 5 MG immediate release tablet Take 1 tablet (5 mg total) by mouth every 4 (four) hours as needed for breakthrough pain. Patient not taking: Reported on 03/08/2021 01/24/21   Diamantina Monks, MD    Allergies Amoxil [amoxicillin], Penicillins, and Latex  No family history on file.  Social History Social History   Tobacco Use  . Smoking status: Former Smoker    Packs/day: 0.20    Types: Cigarettes  . Smokeless tobacco: Never Used  Vaping Use  . Vaping Use: Never used  Substance Use Topics  . Alcohol use: Yes    Comment: occasional  . Drug use: Never    Review of Systems Constitutional: No fever/chills Eyes: No visual changes. ENT: No sore throat. Cardiovascular: Denies chest pain. Respiratory: Positive shortness of breath. Gastrointestinal: Positive abdominal pain.  No nausea, no vomiting.  No diarrhea.   Genitourinary: Negative for dysuria. Musculoskeletal: Positive for right shoulder pain, right-sided neck pain and back pain. Skin: Negative for rash. Neurological: Negative for headaches, focal weakness or numbness. ____________________________________________   PHYSICAL EXAM:  VITAL SIGNS: ED Triage Vitals  Enc Vitals Group     BP 03/08/21 0643 (!) 138/109  Pulse Rate 03/08/21 0643 95     Resp 03/08/21 0643 20     Temp 03/08/21 0643 98.4 F (36.9 C)     Temp Source 03/08/21 0643 Oral     SpO2 03/08/21 0643 98 %     Weight 03/08/21 0644 297 lb (134.7 kg)     Height 03/08/21 0644 5' 9.5" (1.765 m)     Head Circumference --      Peak Flow --      Pain Score 03/08/21 0644 8     Pain Loc --      Pain Edu? --      Excl. in GC? --    Constitutional: Alert and oriented. Well appearing and in no acute  distress. Eyes: Conjunctivae are normal. PERRL. EOMI. Head: Atraumatic. Nose: No congestion/rhinnorhea. Mouth/Throat: Mucous membranes are moist.  Oropharynx non-erythematous. Neck: No stridor.   Hematological/Lymphatic/Immunilogical: No cervical lymphadenopathy. Cardiovascular: Normal rate, regular rhythm. Grossly normal heart sounds.  Good peripheral circulation. Respiratory: Normal respiratory effort.  No retractions. Lungs CTAB. Gastrointestinal: Soft with mild epigastric discomfort.  No distention.  Bowel sounds are normoactive x4 quadrants at present. Musculoskeletal: Moves upper and lower extremities with any difficulty.  No edema noted lower extremities. Neurologic:  Normal speech and language. No gross focal neurologic deficits are appreciated. No gait instability. Skin:  Skin is warm, dry and intact. No rash noted. Psychiatric: Mood and affect are normal. Speech and behavior are normal.  ____________________________________________   LABS (all labs ordered are listed, but only abnormal results are displayed)  Labs Reviewed  D-DIMER, QUANTITATIVE - Abnormal; Notable for the following components:      Result Value   D-Dimer, Quant 1.34 (*)    All other components within normal limits  CBC WITH DIFFERENTIAL/PLATELET - Abnormal; Notable for the following components:   WBC 11.3 (*)    Neutro Abs 8.9 (*)    All other components within normal limits  COMPREHENSIVE METABOLIC PANEL - Abnormal; Notable for the following components:   CO2 20 (*)    Glucose, Bld 107 (*)    BUN 5 (*)    AST 274 (*)    ALT 343 (*)    Alkaline Phosphatase 695 (*)    Total Bilirubin 5.0 (*)    All other components within normal limits  URINALYSIS, COMPLETE (UACMP) WITH MICROSCOPIC - Abnormal; Notable for the following components:   Color, Urine AMBER (*)    APPearance CLEAR (*)    Specific Gravity, Urine 1.004 (*)    Leukocytes,Ua SMALL (*)    Bacteria, UA RARE (*)    All other components within  normal limits  LIPASE, BLOOD - Abnormal; Notable for the following components:   Lipase 4,289 (*)    All other components within normal limits  RESP PANEL BY RT-PCR (FLU A&B, COVID) ARPGX2  CULTURE, BLOOD (ROUTINE X 2)  CULTURE, BLOOD (ROUTINE X 2)  URINE CULTURE  HEPATITIS PANEL, ACUTE  HIV ANTIBODY (ROUTINE TESTING W REFLEX)  PROTIME-INR  APTT  ACETAMINOPHEN LEVEL  POC URINE PREG, ED  TROPONIN I (HIGH SENSITIVITY)   ____________________________________________  EKG   ____________________________________________  RADIOLOGY Beaulah Corin, personally viewed and evaluated these images (plain radiographs) as part of my medical decision making, as well as reviewing the written report by the radiologist.   Official radiology report(s): CT ABDOMEN PELVIS WO CONTRAST  Result Date: 03/08/2021 CLINICAL DATA:  Upper abdominal pain. History of cholecystectomy on 01/24/2021 EXAM: CT ABDOMEN AND PELVIS WITHOUT CONTRAST TECHNIQUE:  Multidetector CT imaging of the abdomen and pelvis was performed following the standard protocol without IV contrast. COMPARISON:  None. FINDINGS: Lower chest: Included lung bases are clear.  Heart size is normal. Hepatobiliary: Status post cholecystectomy. No fluid collections within the gallbladder fossa. Mild intra and extrahepatic biliary ductal dilatation is seen, which may be related to post cholecystectomy changes. No hyperdense stone is seen within the common bile duct. Liver has an otherwise unremarkable unenhanced appearance. No focal liver lesion identified. Pancreas: Very subtle peripancreatic fat stranding adjacent to the pancreatic head and proximal body (series 2, image 30). No peripancreatic fluid or fluid collections. No pancreatic ductal dilatation. Spleen: Normal in size without focal abnormality. Adrenals/Urinary Tract: Unremarkable adrenal glands. Excreted contrast is present within the bilateral renal collecting systems and urinary bladder.  Kidneys have an unremarkable unenhanced appearance. No evidence of renal lesion, stone, or hydronephrosis. No urinary bladder wall thickening or filling defects. Stomach/Bowel: Stomach is within normal limits. Appendix appears normal (series 2, image 64). No evidence of bowel wall thickening, distention, or inflammatory changes. Vascular/Lymphatic: No significant vascular findings are present. No enlarged abdominal or pelvic lymph nodes. Reproductive: Uterus and bilateral adnexa are unremarkable. Other: No free fluid. No abdominopelvic fluid collection. No pneumoperitoneum. No abdominal wall hernia. Musculoskeletal: No acute or significant osseous findings. IMPRESSION: 1. Very subtle peripancreatic fat stranding adjacent to the pancreatic head and proximal body may represent early acute pancreatitis. Correlation with serum lipase levels is recommended. 2. Status post cholecystectomy. No fluid collections within the gallbladder fossa. 3. Mild intra and extrahepatic biliary ductal dilatation, which may be related to post cholecystectomy changes. Electronically Signed   By: Duanne GuessNicholas  Plundo D.O.   On: 03/08/2021 12:32   DG Chest 2 View  Result Date: 03/08/2021 CLINICAL DATA:  25 year old female with shortness of breath and back pain since yesterday. EXAM: CHEST - 2 VIEW COMPARISON:  Chest radiographs 11/21/2020. FINDINGS: Normal lung volumes with stable mild elevation of the right hemidiaphragm, normal variant. Normal cardiac size and mediastinal contours. Visualized tracheal air column is within normal limits. Both lungs appear clear today. No pneumothorax or pleural effusion. No osseous abnormality identified. Cholecystectomy clips are new since February. Negative visible bowel gas pattern. IMPRESSION: 1. Negative chest.  No cardiopulmonary abnormality. 2. Interval cholecystectomy. Electronically Signed   By: Odessa FlemingH  Hall M.D.   On: 03/08/2021 07:22   CT Angio Chest PE W and/or Wo Contrast  Result Date:  03/08/2021 CLINICAL DATA:  Chest pain.  Rule out pulmonary embolus. EXAM: CT ANGIOGRAPHY CHEST WITH CONTRAST TECHNIQUE: Multidetector CT imaging of the chest was performed using the standard protocol during bolus administration of intravenous contrast. Multiplanar CT image reconstructions and MIPs were obtained to evaluate the vascular anatomy. CONTRAST:  75mL OMNIPAQUE IOHEXOL 350 MG/ML SOLN COMPARISON:  Chest x-ray Mar 08, 2021 FINDINGS: Cardiovascular: Satisfactory opacification of the pulmonary arteries to the segmental level. No evidence of pulmonary embolism. Normal heart size. No pericardial effusion. Mediastinum/Nodes: No enlarged mediastinal, hilar, or axillary lymph nodes. Thyroid gland, trachea, and esophagus demonstrate no significant findings. Lungs/Pleura: Central airways are normal. No pneumothorax. Scattered subsegmental atelectasis. No suspicious nodules, masses, or focal infiltrates. Upper Abdomen: The patient is status post cholecystectomy. Intra and extrahepatic biliary duct dilatation identified. No other acute abnormalities are identified. Musculoskeletal: No chest wall abnormality. No acute or significant osseous findings. Review of the MIP images confirms the above findings. IMPRESSION: 1. No pulmonary emboli identified. 2. Mild intra and extrahepatic biliary duct prominence/dilatation is identified. The significance of  this finding is unclear given cholecystectomy on January 24, 2021. Recommend correlation with labs/bilirubin levels. If there is concern for obstruction, MRCP or ERCP could better evaluate. Electronically Signed   By: Gerome Sam III M.D   On: 03/08/2021 10:39    ____________________________________________   PROCEDURES  Procedure(s) performed (including Critical Care):  Procedures   ____________________________________________   INITIAL IMPRESSION / ASSESSMENT AND PLAN / ED COURSE  As part of my medical decision making, I reviewed the following data within  the electronic MEDICAL RECORD NUMBER Notes from prior ED visits and [place] Controlled Substance Database  25 year old female presents to the ED with complaint of right-sided shoulder pain and neck pain with sudden shortness of breath on exertion that started yesterday.  Reports that she had a cholecystectomy 01/24/2021.  She denies any cough, fever, rhinorrhea or other COVID-like symptoms.  Patient states that she has not been vaccinated as of today.  Initial work-up showed D-dimer elevated at 1.34 and negative troponin.  WBC was slightly elevated at 11.3, liver enzymes and total bilirubin were elevated.  CT scan to rule out PE was performed and was negative.  CT abdomen showed early pancreatitis as a differential and lipase was elevated lipase was 4289.  I spoke with Dr. Scotty Court who thought it appropriate for hospitalization and GI consult.  I spoke with Dr. Clyde Lundborg and Dr. Tobi Bastos.  Patient is being hospitalized and an MRCP will be done today.  If an ERCP is indicated Dr. Tobi Bastos will speak to Dr. Servando Snare  about having that done tomorrow. ____________________________________________   FINAL CLINICAL IMPRESSION(S) / ED DIAGNOSES  Final diagnoses:  Other acute pancreatitis, unspecified complication status     ED Discharge Orders    None      *Please note:  Jasmine Sanchez was evaluated in Emergency Department on 03/08/2021 for the symptoms described in the history of present illness. She was evaluated in the context of the global COVID-19 pandemic, which necessitated consideration that the patient might be at risk for infection with the SARS-CoV-2 virus that causes COVID-19. Institutional protocols and algorithms that pertain to the evaluation of patients at risk for COVID-19 are in a state of rapid change based on information released by regulatory bodies including the CDC and federal and state organizations. These policies and algorithms were followed during the patient's care in the ED.  Some ED evaluations and  interventions may be delayed as a result of limited staffing during and the pandemic.*   Note:  This document was prepared using Dragon voice recognition software and may include unintentional dictation errors.    Tommi Rumps, PA-C 03/08/21 1509    Sharman Cheek, MD 03/08/21 [phone]

## 2021-03-08 NOTE — ED Notes (Signed)
Patient transported to CT 

## 2021-03-09 ENCOUNTER — Inpatient Hospital Stay: Payer: Self-pay | Admitting: Anesthesiology

## 2021-03-09 ENCOUNTER — Encounter: Payer: Self-pay | Admitting: Internal Medicine

## 2021-03-09 ENCOUNTER — Encounter
Admission: EM | Disposition: A | Discharge status: Home / Self Care | Payer: Self-pay | Source: Home / Self Care | Attending: Internal Medicine

## 2021-03-09 ENCOUNTER — Inpatient Hospital Stay: Payer: Self-pay

## 2021-03-09 DIAGNOSIS — K8050 Calculus of bile duct without cholangitis or cholecystitis without obstruction: Secondary | ICD-10-CM

## 2021-03-09 DIAGNOSIS — I10 Essential (primary) hypertension: Secondary | ICD-10-CM

## 2021-03-09 HISTORY — PX: ERCP: SHX5425

## 2021-03-09 LAB — URINE CULTURE: Culture: <10000 — AB

## 2021-03-09 LAB — CBC
HCT: 34.4 % — ABNORMAL LOW (ref 36.0–46.0)
Hemoglobin: 11.6 g/dL — ABNORMAL LOW (ref 12.0–15.0)
MCH: 30.2 pg (ref 26.0–34.0)
MCHC: 33.7 g/dL (ref 30.0–36.0)
MCV: 89.6 fL (ref 80.0–100.0)
Platelets: 240 10*3/uL (ref 150–400)
RBC: 3.84 MIL/uL — ABNORMAL LOW (ref 3.87–5.11)
RDW: 14 % (ref 11.5–15.5)
WBC: 6.4 10*3/uL (ref 4.0–10.5)
nRBC: 0 % (ref 0.0–0.2)

## 2021-03-09 LAB — COMPREHENSIVE METABOLIC PANEL
ALT: 228 U/L — ABNORMAL HIGH (ref 0–44)
AST: 130 U/L — ABNORMAL HIGH (ref 15–41)
Albumin: 3.3 g/dL — ABNORMAL LOW (ref 3.5–5.0)
Alkaline Phosphatase: 616 U/L — ABNORMAL HIGH (ref 38–126)
Anion gap: 9 (ref 5–15)
BUN: 7 mg/dL (ref 6–20)
CO2: 22 mmol/L (ref 22–32)
Calcium: 8.6 mg/dL — ABNORMAL LOW (ref 8.9–10.3)
Chloride: 108 mmol/L (ref 98–111)
Creatinine, Ser: 0.52 mg/dL (ref 0.44–1.00)
GFR, Estimated: >60 mL/min (ref >60)
Glucose, Bld: 83 mg/dL (ref 70–99)
Potassium: 3.4 mmol/L — ABNORMAL LOW (ref 3.5–5.1)
Sodium: 139 mmol/L (ref 135–145)
Total Bilirubin: 4.8 mg/dL — ABNORMAL HIGH (ref 0.3–1.2)
Total Protein: 6.7 g/dL (ref 6.5–8.1)

## 2021-03-09 LAB — LIPASE, BLOOD: Lipase: 149 U/L — ABNORMAL HIGH (ref 11–51)

## 2021-03-09 LAB — GLUCOSE, CAPILLARY: Glucose-Capillary: 76 mg/dL (ref 70–99)

## 2021-03-09 SURGERY — ERCP, WITH INTERVENTION IF INDICATED
Anesthesia: General

## 2021-03-09 MED ORDER — DEXAMETHASONE SODIUM PHOSPHATE 10 MG/ML IJ SOLN
INTRAMUSCULAR | Status: DC | PRN
  Administered 2021-03-09: 10 mg via INTRAVENOUS

## 2021-03-09 MED ORDER — ONDANSETRON HCL 4 MG/2ML IJ SOLN: 4.0000 mg | Freq: Once | INTRAMUSCULAR | Status: DC | PRN

## 2021-03-09 MED ORDER — FENTANYL CITRATE (PF) 100 MCG/2ML IJ SOLN
INTRAMUSCULAR | Status: DC | PRN
  Administered 2021-03-09: 100 ug via INTRAVENOUS

## 2021-03-09 MED ORDER — LIDOCAINE HCL (PF) 2 % IJ SOLN
INTRAMUSCULAR | Status: AC
  Filled 2021-03-09: qty 5

## 2021-03-09 MED ORDER — PROPOFOL 500 MG/50ML IV EMUL
INTRAVENOUS | Status: AC
  Filled 2021-03-09: qty 50

## 2021-03-09 MED ORDER — FENTANYL CITRATE (PF) 100 MCG/2ML IJ SOLN: 25.0000 ug | INTRAMUSCULAR | Status: DC | PRN

## 2021-03-09 MED ORDER — KETOROLAC TROMETHAMINE 30 MG/ML IJ SOLN
INTRAMUSCULAR | Status: AC
  Filled 2021-03-09: qty 1

## 2021-03-09 MED ORDER — SUGAMMADEX SODIUM 500 MG/5ML IV SOLN
INTRAVENOUS | Status: DC | PRN
  Administered 2021-03-09: 300 mg via INTRAVENOUS

## 2021-03-09 MED ORDER — ONDANSETRON HCL 4 MG/2ML IJ SOLN
INTRAMUSCULAR | Status: AC
  Filled 2021-03-09: qty 2

## 2021-03-09 MED ORDER — DEXAMETHASONE SODIUM PHOSPHATE 10 MG/ML IJ SOLN
INTRAMUSCULAR | Status: AC
  Filled 2021-03-09: qty 1

## 2021-03-09 MED ORDER — FENTANYL CITRATE (PF) 100 MCG/2ML IJ SOLN
INTRAMUSCULAR | Status: AC
  Filled 2021-03-09: qty 2

## 2021-03-09 MED ORDER — KETOROLAC TROMETHAMINE 30 MG/ML IJ SOLN
INTRAMUSCULAR | Status: DC | PRN
  Administered 2021-03-09: 30 mg via INTRAVENOUS

## 2021-03-09 MED ORDER — ONDANSETRON HCL 4 MG/2ML IJ SOLN
INTRAMUSCULAR | Status: DC | PRN
  Administered 2021-03-09: 4 mg via INTRAVENOUS

## 2021-03-09 MED ORDER — ROCURONIUM BROMIDE 100 MG/10ML IV SOLN
INTRAVENOUS | Status: DC | PRN
  Administered 2021-03-09: 30 mg via INTRAVENOUS

## 2021-03-09 MED ORDER — SUCCINYLCHOLINE CHLORIDE 200 MG/10ML IV SOSY
PREFILLED_SYRINGE | INTRAVENOUS | Status: AC
  Filled 2021-03-09: qty 10

## 2021-03-09 MED ORDER — LIDOCAINE HCL (CARDIAC) PF 100 MG/5ML IV SOSY
PREFILLED_SYRINGE | INTRAVENOUS | Status: DC | PRN
  Administered 2021-03-09: 100 mg via INTRAVENOUS

## 2021-03-09 MED ORDER — SUGAMMADEX SODIUM 500 MG/5ML IV SOLN
INTRAVENOUS | Status: AC
  Filled 2021-03-09: qty 5

## 2021-03-09 MED ORDER — MEPERIDINE HCL 25 MG/ML IJ SOLN: 6.2500 mg | INTRAMUSCULAR | Status: DC | PRN

## 2021-03-09 MED ORDER — PROPOFOL 10 MG/ML IV BOLUS
INTRAVENOUS | Status: DC | PRN
  Administered 2021-03-09: 200 mg via INTRAVENOUS

## 2021-03-09 MED ORDER — METRONIDAZOLE 500 MG/100ML IV SOLN
500.0000 mg | Freq: Three times a day (TID) | INTRAVENOUS | Status: DC
  Administered 2021-03-09 (×2): 500 mg via INTRAVENOUS
  Filled 2021-03-09 (×6): qty 100

## 2021-03-09 MED ORDER — ROCURONIUM BROMIDE 10 MG/ML (PF) SYRINGE
PREFILLED_SYRINGE | INTRAVENOUS | Status: AC
  Filled 2021-03-09: qty 10

## 2021-03-09 MED ORDER — SUCCINYLCHOLINE CHLORIDE 20 MG/ML IJ SOLN
INTRAMUSCULAR | Status: DC | PRN
  Administered 2021-03-09: 100 mg via INTRAVENOUS

## 2021-03-09 MED ORDER — SEVOFLURANE IN SOLN
RESPIRATORY_TRACT | Status: AC
  Filled 2021-03-09: qty 250

## 2021-03-09 MED ORDER — POTASSIUM CHLORIDE 10 MEQ/100ML IV SOLN
10.0000 meq | Freq: Once | INTRAVENOUS | Status: AC
  Administered 2021-03-09: 10 meq via INTRAVENOUS
  Filled 2021-03-09: qty 100

## 2021-03-09 MED ORDER — INDOMETHACIN 50 MG RE SUPP
RECTAL | Status: AC
  Filled 2021-03-09: qty 2

## 2021-03-09 MED ORDER — CIPROFLOXACIN IN D5W 400 MG/200ML IV SOLN
400.0000 mg | Freq: Two times a day (BID) | INTRAVENOUS | Status: DC
  Administered 2021-03-09 (×2): 400 mg via INTRAVENOUS
  Filled 2021-03-09 (×4): qty 200

## 2021-03-09 NOTE — TOC Progression Note (Signed)
Transition of Care [facility]) - Progression Note    Patient Details  Name: Empress Newmann MRN: [id number] Date of Birth: April 18, 1996  Transition of [facility] CM/SW Contact  Caryn Section, RN Phone Number: 03/09/2021, 9:30 AM  Clinical Narrative:   TOC in to see patient at bedside.  Patient A/O x4, lives at home with mother-in-law and husband.  No concerns about returning home or getting to appointments.  Patient has no insurance, application given for [facility] and referral made.  Discharge medications need to be sent to Medication Management.  Patient amenable to this plan, has no further questions.  TOC contact information given, TOC to follow through discharge.         Expected Discharge Plan and Services                                                 Social Determinants of Health (SDOH) Interventions    Readmission Risk Interventions No flowsheet data found.

## 2021-03-09 NOTE — Op Note (Signed)
[facility] Gastroenterology Patient Name: Jasmine Sanchez Procedure Date: 03/09/2021 1:28 PM MRN: [id number] Account #: [id number] Date of Birth: [birth year] Admit Type: Inpatient Age: 25 Room: [facility] ENDO ROOM 4 Gender: Female Note Status: Finalized Procedure:             ERCP Indications:           Common bile duct stone(s) Providers:             Midge Minium MD, MD Referring MD:          No Local Md, MD (Referring MD) Medicines:             General Anesthesia Complications:         No immediate complications. Procedure:             Pre-Anesthesia Assessment:                        - Prior to the procedure, a History and Physical was                         performed, and patient medications and allergies were                         reviewed. The patient's tolerance of previous                         anesthesia was also reviewed. The risks and benefits                         of the procedure and the sedation options and risks                         were discussed with the patient. All questions were                         answered, and informed consent was obtained. Prior                         Anticoagulants: The patient has taken no previous                         anticoagulant or antiplatelet agents. ASA Grade                         Assessment: II - A patient with mild systemic disease.                         After reviewing the risks and benefits, the patient                         was deemed in satisfactory condition to undergo the                         procedure.                        After obtaining informed consent, the scope was passed  under direct vision. Throughout the procedure, the                         patient's blood pressure, pulse, and oxygen                         saturations were monitored continuously. The [employer] D single use duodenoscope was                          introduced through the mouth, and used to inject                         contrast into and used to inject contrast into the                         bile duct. The ERCP was accomplished without                         difficulty. The patient tolerated the procedure well. Findings:      A scout film of the abdomen was obtained. Surgical clips, consistent       with a previous cholecystectomy, were seen in the area of the right       upper quadrant of the abdomen. The esophagus was successfully intubated       under direct vision. The scope was advanced to a normal major papilla in       the descending duodenum without detailed examination of the pharynx,       larynx and associated structures, and upper GI tract. The upper GI tract       was grossly normal. The bile duct was deeply cannulated with the       short-nosed traction sphincterotome. Contrast was injected. I personally       interpreted the bile duct images. There was brisk flow of contrast       through the ducts. Image quality was excellent. Contrast extended to the       entire biliary tree. A wire was passed into the biliary tree. A 7 mm       biliary sphincterotomy was made with a traction (standard)       sphincterotome using ERBE electrocautery. There was no       post-sphincterotomy bleeding. The biliary tree was swept with an 18 mm       balloon starting at the bifurcation. One stone was removed. No stones       remained. Impression:            - Choledocholithiasis was found. Complete removal was                         accomplished by biliary sphincterotomy and balloon                         extraction.                        - A biliary sphincterotomy was performed.                        -  The biliary tree was swept. Recommendation:        - Return patient to hospital ward for ongoing care.                        - Clear liquid diet.                        - Watch for pancreatitis, bleeding, perforation, and                          cholangitis. Procedure Code(s):     --- Professional ---                        [phone], Endoscopic retrograde cholangiopancreatography                         (ERCP); with removal of calculi/debris from                         biliary/pancreatic duct(s)                        [id number], Endoscopic retrograde cholangiopancreatography                         (ERCP); with sphincterotomy/papillotomy                        [phone], Endoscopic catheterization of the biliary                         ductal system, radiological supervision and                         interpretation Diagnosis Code(s):     --- Professional ---                        K80.50, Calculus of bile duct without cholangitis or                         cholecystitis without obstruction CPT copyright 2019 American Medical Association. All rights reserved. The codes documented in this report are preliminary and upon coder review may  be revised to meet current compliance requirements. Midge Minium MD, MD 03/09/2021 3:13:12 PM This report has been signed electronically. Number of Addenda: 0 Note Initiated On: 03/09/2021 1:28 PM Estimated Blood Loss:  Estimated blood loss: none.      [facility]

## 2021-03-09 NOTE — Anesthesia Preprocedure Evaluation (Signed)
Anesthesia Evaluation  Patient identified by MRN, date of birth, ID band Patient awake    Reviewed: Allergy & Precautions, NPO status , Patient's Chart, lab work & pertinent test results  Airway Mallampati: II  TM Distance: >3 FB Neck ROM: Full    Dental  (+) Teeth Intact, Dental Advisory Given   Pulmonary former smoker,    Pulmonary exam normal breath sounds clear to auscultation       Cardiovascular hypertension, negative cardio ROS Normal cardiovascular exam Rhythm:Regular Rate:Normal     Neuro/Psych negative neurological ROS  negative psych ROS   GI/Hepatic negative GI ROS, CHOLECYSTITIS   Endo/Other  Morbid obesity  Renal/GU negative Renal ROS     Musculoskeletal negative musculoskeletal ROS (+)   Abdominal   Peds  Hematology negative hematology ROS (+)   Anesthesia Other Findings Day of surgery medications reviewed with the patient.  Reproductive/Obstetrics                             Anesthesia Physical  Anesthesia Plan  ASA: II  Anesthesia Plan: General   Post-op Pain Management:    Induction: Intravenous  PONV Risk Score and Plan: 3 and Midazolam, Dexamethasone, Ondansetron and Treatment may vary due to age or medical condition  Airway Management Planned: Oral ETT  Additional Equipment:   Intra-op Plan:   Post-operative Plan: Extubation in OR  Informed Consent: I have reviewed the patients History and Physical, chart, labs and discussed the procedure including the risks, benefits and alternatives for the proposed anesthesia with the patient or authorized representative who has indicated his/her understanding and acceptance.     Dental advisory given  Plan Discussed with: CRNA, Anesthesiologist and Surgeon  Anesthesia Plan Comments:         Anesthesia Quick Evaluation

## 2021-03-09 NOTE — Plan of Care (Signed)

## 2021-03-09 NOTE — Anesthesia Procedure Notes (Signed)
Procedure Name: Intubation Date/Time: 03/09/2021 2:29 PM Performed by: Omer Jack, CRNA Pre-anesthesia Checklist: Patient identified, Patient being monitored, Timeout performed, Emergency Drugs available and Suction available Patient Re-evaluated:Patient Re-evaluated prior to induction Oxygen Delivery Method: Circle system utilized Preoxygenation: Pre-oxygenation with 100% oxygen Induction Type: IV induction Ventilation: Mask ventilation without difficulty Laryngoscope Size: 3 and McGraph Grade View: Grade I Tube type: Oral Tube size: 7.0 mm Number of attempts: 1 Airway Equipment and Method: Stylet Placement Confirmation: ETT inserted through vocal cords under direct vision,  positive ETCO2 and breath sounds checked- equal and bilateral Secured at: 21 cm Tube secured with: Tape Dental Injury: Teeth and Oropharynx as per pre-operative assessment

## 2021-03-09 NOTE — Anesthesia Postprocedure Evaluation (Signed)
Anesthesia Post Note  Patient: Jasmine Sanchez  Procedure(s) Performed: ENDOSCOPIC RETROGRADE CHOLANGIOPANCREATOGRAPHY (ERCP) (N/A )  Patient location during evaluation: PACU Anesthesia Type: General Level of consciousness: awake and alert Pain management: pain level controlled Vital Signs Assessment: post-procedure vital signs reviewed and stable Respiratory status: spontaneous breathing, nonlabored ventilation, respiratory function stable and patient connected to nasal cannula oxygen Cardiovascular status: blood pressure returned to baseline and stable Postop Assessment: no apparent nausea or vomiting Anesthetic complications: no   No complications documented.   Last Vitals:  Vitals:   03/09/21 1530 03/09/21 1544  BP: 132/73 129/75  Pulse: 71 62  Resp: 14 16  Temp:  36.6 C  SpO2: 98% 100%    Last Pain:  Vitals:   03/09/21 1544  TempSrc:   PainSc: 0-No pain                 Corinda Gubler

## 2021-03-09 NOTE — Transfer of Care (Signed)
Immediate Anesthesia Transfer of Care Note  Patient: Seriyah Marse  Procedure(s) Performed: ENDOSCOPIC RETROGRADE CHOLANGIOPANCREATOGRAPHY (ERCP) (N/A )  Patient Location: PACU  Anesthesia Type:General  Level of Consciousness: drowsy and patient cooperative  Airway & Oxygen Therapy: Patient Spontanous Breathing  Post-op Assessment: Report given to RN and Post -op Vital signs reviewed and stable  Post vital signs: Reviewed and stable  Last Vitals:  Vitals Value Taken Time  BP 136/62 03/09/21 1521  Temp    Pulse 79 03/09/21 1523  Resp 18 03/09/21 1524  SpO2 100 % 03/09/21 1523  Vitals shown include unvalidated device data.  Last Pain:  Vitals:   03/09/21 1346  TempSrc: Tympanic  PainSc: 1          Complications: No complications documented.

## 2021-03-09 NOTE — Progress Notes (Signed)
PROGRESS NOTE    Jasmine Sanchez  RUE:[id number] DOB: 1995/11/04 DOA: 03/08/2021 PCP: Patient, No Pcp Per (Inactive)   Chief complaint abdominal pain. Brief Narrative:  Jasmine Sanchez is a 25 y.o. female with medical history significant of hypertension, former smoker, migraine headache, s/p for cholecystectomy 01/24/2021, who presents with abdominal pain.  Patient has an elevated lipase, bilirubin 5.0.  CT abdomen/pelvis showed.  Ductal dilation.  MRCP showed stone in the common bile duct.  Patient has been seen by GI for ERCP.   Assessment & Plan:   Principal Problem:   Acute pancreatitis Active Problems:   Abnormal LFTs   Hypertension   Leukocytosis   Positive D dimer  #1.  Acute pancreatitis secondary to common bile duct obstruction. Common bile duct stone with obstruction. Patient has been seen by GI, planning for ERCP. Patient is also placed on antibiotics, will discontinue after procedure. Condition improving, lipase normalized.  2.  Morbid obesity P  3.  Essential hypertension. Continue to monitor.  4.  Hypokalemia Repleted through IV.    DVT prophylaxis: None pain ERCP Code Status: Full Family Communication: Mother at bedside. Disposition Plan:  .   Status is: Inpatient  Remains inpatient appropriate because:Inpatient level of care appropriate due to severity of illness   Dispo: The patient is from: Home              Anticipated d/c is to: Home              Patient currently is not medically stable to d/c.   Difficult to place patient No        I/O last 3 completed shifts: In: 1405.9 [I.V.:1405.9] Out: -  No intake/output data recorded.     Consultants:   GI  Procedures: Pending  Antimicrobials:   Subjective: Patient feels better today, abdominal pain has resolved.  No nausea vomiting. No short of breath or cough. No fever chills  No dysuria hematuria.  Objective: Vitals:   03/08/21 1959 03/09/21 0508 03/09/21 0829 03/09/21 1237   BP: (!) 104/54 (!) 104/55 128/73 133/78  Pulse: (!) 56 (!) 57 (!) 59 66  Resp: 17 17 (!) 22 15  Temp: 97.8 F (36.6 C) 98.1 F (36.7 C) 98.3 F (36.8 C) 98.6 F (37 C)  TempSrc: Oral Oral Oral Oral  SpO2: 99% 98% 99% 100%  Weight:      Height:        Intake/Output Summary (Last 24 hours) at 03/09/2021 1301 Last data filed at 03/09/2021 0316 Gross per 24 hour  Intake 1405.93 ml  Output --  Net 1405.93 ml   Filed Weights   03/08/21 0644  Weight: 134.7 kg    Examination:  General exam: Appears calm and comfortable, morbid obese. Respiratory system: Clear to auscultation. Respiratory effort normal. Cardiovascular system: S1 & S2 heard, RRR. No JVD, murmurs, rubs, gallops or clicks. No pedal edema. Gastrointestinal system: Abdomen is nondistended, soft and nontender. No organomegaly or masses felt. Normal bowel sounds heard. Central nervous system: Alert and oriented. No focal neurological deficits. Extremities: Symmetric 5 x 5 power. Skin: No rashes, lesions or ulcers Psychiatry: Judgement and insight appear normal. Mood & affect appropriate.     Data Reviewed: I have personally reviewed following labs and imaging studies  CBC: Recent Labs  Lab 03/08/21 0800 03/09/21 0456  WBC 11.3* 6.4  NEUTROABS 8.9*  --   HGB 12.9 11.6*  HCT 38.0 34.4*  MCV 89.2 89.6  PLT 316 240   Basic Metabolic  Panel: Recent Labs  Lab 03/08/21 0800 03/09/21 0456  NA 137 139  K 4.1 3.4*  CL 106 108  CO2 20* 22  GLUCOSE 107* 83  BUN 5* 7  CREATININE 0.53 0.52  CALCIUM 8.9 8.6*   GFR: Estimated Creatinine Clearance: 160 mL/min (by C-G formula based on SCr of 0.52 mg/dL). Liver Function Tests: Recent Labs  Lab 03/08/21 0800 03/09/21 0456  AST 274* 130*  ALT 343* 228*  ALKPHOS 695* 616*  BILITOT 5.0* 4.8*  PROT 7.9 6.7  ALBUMIN 3.9 3.3*   Recent Labs  Lab 03/08/21 0800 03/09/21 0456  LIPASE 4,289* 149*   No results for input(s): AMMONIA in the last 168  hours. Coagulation Profile: Recent Labs  Lab 03/08/21 1722  INR 1.0   Cardiac Enzymes: No results for input(s): CKTOTAL, CKMB, CKMBINDEX, TROPONINI in the last 168 hours. BNP (last 3 results) No results for input(s): PROBNP in the last 8760 hours. HbA1C: No results for input(s): HGBA1C in the last 72 hours. CBG: Recent Labs  Lab 03/08/21 1720 03/09/21 0811  GLUCAP 74 76   Lipid Profile: Recent Labs    03/08/21 1723  TRIG 79   Thyroid Function Tests: No results for input(s): TSH, T4TOTAL, FREET4, T3FREE, THYROIDAB in the last 72 hours. Anemia Panel: No results for input(s): VITAMINB12, FOLATE, FERRITIN, TIBC, IRON, RETICCTPCT in the last 72 hours. Sepsis Labs: No results for input(s): PROCALCITON, LATICACIDVEN in the last 168 hours.  Recent Results (from the past 240 hour(s))  Urine Culture     Status: None (Preliminary result)   Collection Time: 03/08/21  8:00 AM   Specimen: Urine, Random  Result Value Ref Range Status   Specimen Description   Final    URINE, RANDOM Performed at [facility], [address] [place], [place] [address]    Special Requests   Final    NONE Performed at [facility], [address] [place], [place] [address]    Culture   Final    CULTURE REINCUBATED FOR BETTER GROWTH Performed at [facility], [address] [place], [place] [address]    Report Status PENDING  Incomplete  Resp Panel by RT-PCR (Flu A&B, Covid) Nasopharyngeal Swab     Status: None   Collection Time: 03/08/21  3:06 PM   Specimen: Nasopharyngeal Swab; Nasopharyngeal(NP) swabs in vial transport medium  Result Value Ref Range Status   SARS Coronavirus 2 by RT PCR NEGATIVE NEGATIVE Final    Comment: (NOTE) SARS-CoV-2 target nucleic acids are NOT DETECTED.  The SARS-CoV-2 RNA is generally detectable in upper respiratory specimens during the acute phase of infection. The lowest concentration of SARS-CoV-2 viral copies this assay can detect  is 138 copies/mL. A negative result does not preclude SARS-Cov-2 infection and should not be used as the sole basis for treatment or other patient management decisions. A negative result may occur with  improper specimen collection/handling, submission of specimen other than nasopharyngeal swab, presence of viral mutation(s) within the areas targeted by this assay, and inadequate number of viral copies(<138 copies/mL). A negative result must be combined with clinical observations, patient history, and epidemiological information. The expected result is Negative.  Fact Sheet for Patients:  BloggerCourse.com  Fact Sheet for Healthcare Providers:  SeriousBroker.it  This test is no t yet approved or cleared by the [place] FDA and  has been authorized for detection and/or diagnosis of SARS-CoV-2 by FDA under an Emergency Use Authorization (EUA). This EUA will remain  in  effect (meaning this test can be used) for the duration of the COVID-19 declaration under Section 564(b)(1) of the Act, 21 U.S.C.section 360bbb-3(b)(1), unless the authorization is terminated  or revoked sooner.       Influenza A by PCR NEGATIVE NEGATIVE Final   Influenza B by PCR NEGATIVE NEGATIVE Final    Comment: (NOTE) The Xpert Xpress SARS-CoV-2/FLU/RSV plus assay is intended as an aid in the diagnosis of influenza from Nasopharyngeal swab specimens and should not be used as a sole basis for treatment. Nasal washings and aspirates are unacceptable for Xpert Xpress SARS-CoV-2/FLU/RSV testing.  Fact Sheet for Patients: BloggerCourse.comhttps://www.fda.gov/media/152166/download  Fact Sheet for Healthcare Providers: SeriousBroker.ithttps://www.fda.gov/media/152162/download  This test is not yet approved or cleared by the [place] FDA and has been authorized for detection and/or diagnosis of SARS-CoV-2 by FDA under an Emergency Use Authorization (EUA). This EUA will remain in effect  (meaning this test can be used) for the duration of the COVID-19 declaration under Section 564(b)(1) of the Act, 21 U.S.C. section 360bbb-3(b)(1), unless the authorization is terminated or revoked.  Performed at [facility], [address] [place], [place] [address]   CULTURE, BLOOD (ROUTINE X 2) w Reflex to ID Panel     Status: None (Preliminary result)   Collection Time: 03/08/21  5:24 PM   Specimen: BLOOD  Result Value Ref Range Status   Specimen Description BLOOD RIGHT ANTECUBITAL  Final   Special Requests   Final    BOTTLES DRAWN AEROBIC AND ANAEROBIC Blood Culture results may not be optimal due to an excessive volume of blood received in culture bottles   Culture   Final    NO GROWTH < 24 HOURS Performed at [facility], [address] [place], [place] [address]    Report Status PENDING  Incomplete  CULTURE, BLOOD (ROUTINE X 2) w Reflex to ID Panel     Status: None (Preliminary result)   Collection Time: 03/08/21  5:24 PM   Specimen: BLOOD  Result Value Ref Range Status   Specimen Description BLOOD BLOOD RIGHT HAND  Final   Special Requests   Final    BOTTLES DRAWN AEROBIC AND ANAEROBIC Blood Culture results may not be optimal due to an inadequate volume of blood received in culture bottles   Culture   Final    NO GROWTH < 24 HOURS Performed at [facility], [address] [place], [place] [address]    Report Status PENDING  Incomplete         Radiology Studies: CT ABDOMEN PELVIS WO CONTRAST  Result Date: 03/08/2021 CLINICAL DATA:  Upper abdominal pain. History of cholecystectomy on 01/24/2021 EXAM: CT ABDOMEN AND PELVIS WITHOUT CONTRAST TECHNIQUE: Multidetector CT imaging of the abdomen and pelvis was performed following the standard protocol without IV contrast. COMPARISON:  None. FINDINGS: Lower chest: Included lung bases are clear.  Heart size is normal. Hepatobiliary: Status post cholecystectomy. No fluid collections within the  gallbladder fossa. Mild intra and extrahepatic biliary ductal dilatation is seen, which may be related to post cholecystectomy changes. No hyperdense stone is seen within the common bile duct. Liver has an otherwise unremarkable unenhanced appearance. No focal liver lesion identified. Pancreas: Very subtle peripancreatic fat stranding adjacent to the pancreatic head and proximal body (series 2, image 30). No peripancreatic fluid or fluid collections. No pancreatic ductal dilatation. Spleen: Normal in size without focal abnormality. Adrenals/Urinary Tract: Unremarkable adrenal glands. Excreted contrast is present within the bilateral renal collecting systems and urinary bladder. Kidneys  have an unremarkable unenhanced appearance. No evidence of renal lesion, stone, or hydronephrosis. No urinary bladder wall thickening or filling defects. Stomach/Bowel: Stomach is within normal limits. Appendix appears normal (series 2, image 64). No evidence of bowel wall thickening, distention, or inflammatory changes. Vascular/Lymphatic: No significant vascular findings are present. No enlarged abdominal or pelvic lymph nodes. Reproductive: Uterus and bilateral adnexa are unremarkable. Other: No free fluid. No abdominopelvic fluid collection. No pneumoperitoneum. No abdominal wall hernia. Musculoskeletal: No acute or significant osseous findings. IMPRESSION: 1. Very subtle peripancreatic fat stranding adjacent to the pancreatic head and proximal body may represent early acute pancreatitis. Correlation with serum lipase levels is recommended. 2. Status post cholecystectomy. No fluid collections within the gallbladder fossa. 3. Mild intra and extrahepatic biliary ductal dilatation, which may be related to post cholecystectomy changes. Electronically Signed   By: Duanne Guess D.O.   On: 03/08/2021 12:32   DG Chest 2 View  Result Date: 03/08/2021 CLINICAL DATA:  25 year old female with shortness of breath and back pain since  yesterday. EXAM: CHEST - 2 VIEW COMPARISON:  Chest radiographs 11/21/2020. FINDINGS: Normal lung volumes with stable mild elevation of the right hemidiaphragm, normal variant. Normal cardiac size and mediastinal contours. Visualized tracheal air column is within normal limits. Both lungs appear clear today. No pneumothorax or pleural effusion. No osseous abnormality identified. Cholecystectomy clips are new since February. Negative visible bowel gas pattern. IMPRESSION: 1. Negative chest.  No cardiopulmonary abnormality. 2. Interval cholecystectomy. Electronically Signed   By: Odessa Fleming M.D.   On: 03/08/2021 07:22   CT Angio Chest PE W and/or Wo Contrast  Result Date: 03/08/2021 CLINICAL DATA:  Chest pain.  Rule out pulmonary embolus. EXAM: CT ANGIOGRAPHY CHEST WITH CONTRAST TECHNIQUE: Multidetector CT imaging of the chest was performed using the standard protocol during bolus administration of intravenous contrast. Multiplanar CT image reconstructions and MIPs were obtained to evaluate the vascular anatomy. CONTRAST:  75mL OMNIPAQUE IOHEXOL 350 MG/ML SOLN COMPARISON:  Chest x-ray Mar 08, 2021 FINDINGS: Cardiovascular: Satisfactory opacification of the pulmonary arteries to the segmental level. No evidence of pulmonary embolism. Normal heart size. No pericardial effusion. Mediastinum/Nodes: No enlarged mediastinal, hilar, or axillary lymph nodes. Thyroid gland, trachea, and esophagus demonstrate no significant findings. Lungs/Pleura: Central airways are normal. No pneumothorax. Scattered subsegmental atelectasis. No suspicious nodules, masses, or focal infiltrates. Upper Abdomen: The patient is status post cholecystectomy. Intra and extrahepatic biliary duct dilatation identified. No other acute abnormalities are identified. Musculoskeletal: No chest wall abnormality. No acute or significant osseous findings. Review of the MIP images confirms the above findings. IMPRESSION: 1. No pulmonary emboli identified. 2.  Mild intra and extrahepatic biliary duct prominence/dilatation is identified. The significance of this finding is unclear given cholecystectomy on January 24, 2021. Recommend correlation with labs/bilirubin levels. If there is concern for obstruction, MRCP or ERCP could better evaluate. Electronically Signed   By: Gerome Sam III M.D   On: 03/08/2021 10:39   MR 3D Recon At Scanner  Result Date: 03/08/2021 CLINICAL DATA:  Cholecystectomy 01/24/2021. Epigastric abdominal pain. EXAM: MRI ABDOMEN WITHOUT AND WITH CONTRAST (INCLUDING MRCP) TECHNIQUE: Multiplanar multisequence MR imaging of the abdomen was performed both before and after the administration of intravenous contrast. Heavily T2-weighted images of the biliary and pancreatic ducts were obtained, and three-dimensional MRCP images were rendered by post processing. CONTRAST:  10mL GADAVIST GADOBUTROL 1 MMOL/ML IV SOLN COMPARISON:  None. FINDINGS: Lower chest: Unremarkable Hepatobiliary: The gallbladder is absent. Minimal susceptibility artifact and fluid is seen  within the gallbladder fossa, in keeping with given history of recent cholecystectomy. The liver is otherwise unremarkable. No intra or extrahepatic biliary ductal dilation. However, a 5 mm x 2 mm rounded calculus is seen within the distal common duct (image # 18/series # 3). There is enhancement of the distal bile duct in keeping with inflammatory change. A trace amount of a peribiliary edema is present along the distal common duct. Pancreas:  Unremarkable Spleen:  Unremarkable Adrenals/Urinary Tract: The adrenal glands are unremarkable. The kidneys are normal. Stomach/Bowel: The visualized bowel is unremarkable. Vascular/Lymphatic: No pathologic adenopathy within the abdomen. The abdominal vasculature is unremarkable. Other:  None. Musculoskeletal: No suspicious bone lesions identified. IMPRESSION: Choledocholithiasis with retained 5 mm x 2 mm calculus within the distal common duct. Inflammatory  changes in keeping with cholangitis with biliary enhancement and trace peribiliary edema. No intra or extrahepatic biliary ductal dilation. Status post cholecystectomy. These results will be called to the ordering clinician or representative by the Radiologist Assistant, and communication documented in the PACS or [employer]. Electronically Signed   By: Helyn Numbers MD   On: 03/08/2021 23:43   US Venous Img Lower Bilateral (DVT)  Result Date: 03/08/2021 CLINICAL DATA:  Positive D-dimer level. EXAM: BILATERAL LOWER EXTREMITY VENOUS DOPPLER ULTRASOUND TECHNIQUE: Gray-scale sonography with graded compression, as well as color Doppler and duplex ultrasound were performed to evaluate the lower extremity deep venous systems from the level of the common femoral vein and including the common femoral, femoral, profunda femoral, popliteal and calf veins including the posterior tibial, peroneal and gastrocnemius veins when visible. The superficial great saphenous vein was also interrogated. Spectral Doppler was utilized to evaluate flow at rest and with distal augmentation maneuvers in the common femoral, femoral and popliteal veins. COMPARISON:  None. FINDINGS: RIGHT LOWER EXTREMITY Common Femoral Vein: No evidence of thrombus. Normal compressibility, respiratory phasicity and response to augmentation. Saphenofemoral Junction: No evidence of thrombus. Normal compressibility and flow on color Doppler imaging. Profunda Femoral Vein: No evidence of thrombus. Normal compressibility and flow on color Doppler imaging. Femoral Vein: No evidence of thrombus. Normal compressibility, respiratory phasicity and response to augmentation. Popliteal Vein: No evidence of thrombus. Normal compressibility, respiratory phasicity and response to augmentation. Calf Veins: No evidence of thrombus. Normal compressibility and flow on color Doppler imaging. Venous Reflux:  None. Other Findings:  None. LEFT LOWER EXTREMITY Common Femoral  Vein: No evidence of thrombus. Normal compressibility, respiratory phasicity and response to augmentation. Saphenofemoral Junction: No evidence of thrombus. Normal compressibility and flow on color Doppler imaging. Profunda Femoral Vein: No evidence of thrombus. Normal compressibility and flow on color Doppler imaging. Femoral Vein: No evidence of thrombus. Normal compressibility, respiratory phasicity and response to augmentation. Popliteal Vein: No evidence of thrombus. Normal compressibility, respiratory phasicity and response to augmentation. Calf Veins: No evidence of thrombus. Normal compressibility and flow on color Doppler imaging. Venous Reflux:  None. Other Findings:  None. IMPRESSION: No evidence of deep venous thrombosis in either lower extremity. Electronically Signed   By: Lupita Raider M.D.   On: 03/08/2021 16:56   MR ABDOMEN MRCP W WO CONTAST  Result Date: 03/08/2021 CLINICAL DATA:  Cholecystectomy 01/24/2021. Epigastric abdominal pain. EXAM: MRI ABDOMEN WITHOUT AND WITH CONTRAST (INCLUDING MRCP) TECHNIQUE: Multiplanar multisequence MR imaging of the abdomen was performed both before and after the administration of intravenous contrast. Heavily T2-weighted images of the biliary and pancreatic ducts were obtained, and three-dimensional MRCP images were rendered by post processing. CONTRAST:  62mL GADAVIST GADOBUTROL  1 MMOL/ML IV SOLN COMPARISON:  None. FINDINGS: Lower chest: Unremarkable Hepatobiliary: The gallbladder is absent. Minimal susceptibility artifact and fluid is seen within the gallbladder fossa, in keeping with given history of recent cholecystectomy. The liver is otherwise unremarkable. No intra or extrahepatic biliary ductal dilation. However, a 5 mm x 2 mm rounded calculus is seen within the distal common duct (image # 18/series # 3). There is enhancement of the distal bile duct in keeping with inflammatory change. A trace amount of a peribiliary edema is present along the distal  common duct. Pancreas:  Unremarkable Spleen:  Unremarkable Adrenals/Urinary Tract: The adrenal glands are unremarkable. The kidneys are normal. Stomach/Bowel: The visualized bowel is unremarkable. Vascular/Lymphatic: No pathologic adenopathy within the abdomen. The abdominal vasculature is unremarkable. Other:  None. Musculoskeletal: No suspicious bone lesions identified. IMPRESSION: Choledocholithiasis with retained 5 mm x 2 mm calculus within the distal common duct. Inflammatory changes in keeping with cholangitis with biliary enhancement and trace peribiliary edema. No intra or extrahepatic biliary ductal dilation. Status post cholecystectomy. These results will be called to the ordering clinician or representative by the Radiologist Assistant, and communication documented in the PACS or [employer]. Electronically Signed   By: Helyn Numbers MD   On: 03/08/2021 23:43        Scheduled Meds: Continuous Infusions: . ciprofloxacin 400 mg (03/09/21 0920)  . lactated ringers 200 mL/hr at 03/09/21 0316  . metronidazole 500 mg (03/09/21 0741)     LOS: 1 day    Time spent: 23 minutes    Marrion Coy, MD [facility]   To contact the attending provider between 7A-7P or the covering provider during after hours 7P-7A, please log into the web site [email or web address] and access using universal [place] password for that web site. If you do not have the password, please call the hospital operator.  03/09/2021, 1:01 PM

## 2021-03-09 NOTE — Consult Note (Signed)
Midge Minium, MD [facility]  [address], [address] [place], [place] [address] Phone: [phone] Fax : [phone]  Consultation  Referring Provider:     Dr. Chipper Herb Primary Care Physician:  Patient, No Pcp Per (Inactive) Primary Gastroenterologist: Gentry Fitz         Reason for Consultation:     Choledocholithiasis  Date of Admission:  03/08/2021 Date of Consultation:  03/09/2021         HPI:   Jasmine Sanchez is a 25 y.o. female who had her gallbladder out back on 4 9 of this year.  That was done in [place].  The patient now comes in with what appears to be gallstone pancreatitis.  The patient had an MRCP yesterday and was found to have a stone in the common bile duct.  The patient's lipase 1 month ago was 29 with her lipase on admission yesterday at 4289 and this morning down to 149.  There is also signs of cholangitis on the MRCP.  The patient's white cell count was 11.3 and it came down to 6.4 today.  The patient's liver enzymes on admission showed AST of 274 ALT of 343 alkaline phosphatase of 695 and a total bilirubin of 5.0.  The labs today showed the AST of 130 ALT of 228 alkaline phosphatase of 616 and a bilirubin of 4.8.  The patient reports that she feels much better today.  Past Medical History:  Diagnosis Date  . Hypertension   . Known health problems: none     Past Surgical History:  Procedure Laterality Date  . CHOLECYSTECTOMY N/A 01/24/2021   Procedure: LAPAROSCOPIC CHOLECYSTECTOMY;  Surgeon: Diamantina Monks, MD;  Location: [facility] OR;  Service: General;  Laterality: N/A;  . TONSILLECTOMY      Prior to Admission medications   Medication Sig Start Date End Date Taking? Authorizing Provider  ibuprofen (ADVIL) 800 MG tablet Take 1 tablet (800 mg total) by mouth every 8 (eight) hours as needed. 01/24/21  Yes Diamantina Monks, MD  docusate sodium (COLACE) 100 MG capsule Take 1 tablet once or twice daily as needed for constipation while taking narcotic pain medicine Patient not  taking: Reported on 03/08/2021 11/22/20   Loleta Rose, MD  ondansetron (ZOFRAN ODT) 4 MG disintegrating tablet Allow 1-2 tablets to dissolve in your mouth every 8 hours as needed for nausea/vomiting Patient not taking: Reported on 03/08/2021 11/22/20   Loleta Rose, MD  oxyCODONE (ROXICODONE) 5 MG immediate release tablet Take 1 tablet (5 mg total) by mouth every 4 (four) hours as needed for breakthrough pain. Patient not taking: Reported on 03/08/2021 01/24/21   Diamantina Monks, MD    Family History  Problem Relation Age of Onset  . Ulcerative colitis Father   . Ulcerative colitis Sister      Social History   Tobacco Use  . Smoking status: Former Smoker    Packs/day: 0.20    Types: Cigarettes  . Smokeless tobacco: Never Used  Vaping Use  . Vaping Use: Never used  Substance Use Topics  . Alcohol use: Yes    Comment: occasional  . Drug use: Never    Allergies as of 03/08/2021 - Review Complete 03/08/2021  Allergen Reaction Noted  . Amoxil [amoxicillin] Anaphylaxis 03/08/2021  . Penicillins Anaphylaxis 11/21/2020  . Latex  01/24/2021    Review of Systems:    All systems reviewed and negative except where noted in HPI.   Physical Exam:  Vital signs in last 24 hours: Temp:  [  97.8 F (36.6 C)-98.6 F (37 C)] 98.6 F (37 C) (05/23 1237) Pulse Rate:  [56-78] 66 (05/23 1237) Resp:  [14-22] 15 (05/23 1237) BP: (104-133)/(54-83) 133/78 (05/23 1237) SpO2:  [98 %-100 %] 100 % (05/23 1237) Last BM Date: 03/07/21 General:   Pleasant, cooperative in NAD Head:  Normocephalic and atraumatic. Eyes:   No icterus.   Conjunctiva pink. PERRLA. Ears:  Normal auditory acuity. Neck:  Supple; no masses or thyroidomegaly Lungs: Respirations even and unlabored. Lungs clear to auscultation bilaterally.   No wheezes, crackles, or rhonchi.  Heart:  Regular rate and rhythm;  Without murmur, clicks, rubs or gallops Abdomen:  Soft, nondistended, nontender. Normal bowel sounds. No appreciable masses  or hepatomegaly.  No rebound or guarding.  Rectal:  Not performed. Msk:  Symmetrical without gross deformities.    Extremities:  Without edema, cyanosis or clubbing. Neurologic:  Alert and oriented x3;  grossly normal neurologically. Skin:  Intact without significant lesions or rashes. Cervical Nodes:  No significant cervical adenopathy. Psych:  Alert and cooperative. Normal affect.  LAB RESULTS: Recent Labs    03/08/21 0800 03/09/21 0456  WBC 11.3* 6.4  HGB 12.9 11.6*  HCT 38.0 34.4*  PLT 316 240   BMET Recent Labs    03/08/21 0800 03/09/21 0456  NA 137 139  K 4.1 3.4*  CL 106 108  CO2 20* 22  GLUCOSE 107* 83  BUN 5* 7  CREATININE 0.53 0.52  CALCIUM 8.9 8.6*   LFT Recent Labs    03/09/21 0456  PROT 6.7  ALBUMIN 3.3*  AST 130*  ALT 228*  ALKPHOS 616*  BILITOT 4.8*   PT/INR Recent Labs    03/08/21 1722  LABPROT 13.2  INR 1.0    STUDIES: CT ABDOMEN PELVIS WO CONTRAST  Result Date: 03/08/2021 CLINICAL DATA:  Upper abdominal pain. History of cholecystectomy on 01/24/2021 EXAM: CT ABDOMEN AND PELVIS WITHOUT CONTRAST TECHNIQUE: Multidetector CT imaging of the abdomen and pelvis was performed following the standard protocol without IV contrast. COMPARISON:  None. FINDINGS: Lower chest: Included lung bases are clear.  Heart size is normal. Hepatobiliary: Status post cholecystectomy. No fluid collections within the gallbladder fossa. Mild intra and extrahepatic biliary ductal dilatation is seen, which may be related to post cholecystectomy changes. No hyperdense stone is seen within the common bile duct. Liver has an otherwise unremarkable unenhanced appearance. No focal liver lesion identified. Pancreas: Very subtle peripancreatic fat stranding adjacent to the pancreatic head and proximal body (series 2, image 30). No peripancreatic fluid or fluid collections. No pancreatic ductal dilatation. Spleen: Normal in size without focal abnormality. Adrenals/Urinary Tract:  Unremarkable adrenal glands. Excreted contrast is present within the bilateral renal collecting systems and urinary bladder. Kidneys have an unremarkable unenhanced appearance. No evidence of renal lesion, stone, or hydronephrosis. No urinary bladder wall thickening or filling defects. Stomach/Bowel: Stomach is within normal limits. Appendix appears normal (series 2, image 64). No evidence of bowel wall thickening, distention, or inflammatory changes. Vascular/Lymphatic: No significant vascular findings are present. No enlarged abdominal or pelvic lymph nodes. Reproductive: Uterus and bilateral adnexa are unremarkable. Other: No free fluid. No abdominopelvic fluid collection. No pneumoperitoneum. No abdominal wall hernia. Musculoskeletal: No acute or significant osseous findings. IMPRESSION: 1. Very subtle peripancreatic fat stranding adjacent to the pancreatic head and proximal body may represent early acute pancreatitis. Correlation with serum lipase levels is recommended. 2. Status post cholecystectomy. No fluid collections within the gallbladder fossa. 3. Mild intra and extrahepatic biliary ductal dilatation, which  may be related to post cholecystectomy changes. Electronically Signed   By: Duanne Guess D.O.   On: 03/08/2021 12:32   DG Chest 2 View  Result Date: 03/08/2021 CLINICAL DATA:  25 year old female with shortness of breath and back pain since yesterday. EXAM: CHEST - 2 VIEW COMPARISON:  Chest radiographs 11/21/2020. FINDINGS: Normal lung volumes with stable mild elevation of the right hemidiaphragm, normal variant. Normal cardiac size and mediastinal contours. Visualized tracheal air column is within normal limits. Both lungs appear clear today. No pneumothorax or pleural effusion. No osseous abnormality identified. Cholecystectomy clips are new since February. Negative visible bowel gas pattern. IMPRESSION: 1. Negative chest.  No cardiopulmonary abnormality. 2. Interval cholecystectomy.  Electronically Signed   By: Odessa Fleming M.D.   On: 03/08/2021 07:22   CT Angio Chest PE W and/or Wo Contrast  Result Date: 03/08/2021 CLINICAL DATA:  Chest pain.  Rule out pulmonary embolus. EXAM: CT ANGIOGRAPHY CHEST WITH CONTRAST TECHNIQUE: Multidetector CT imaging of the chest was performed using the standard protocol during bolus administration of intravenous contrast. Multiplanar CT image reconstructions and MIPs were obtained to evaluate the vascular anatomy. CONTRAST:  75mL OMNIPAQUE IOHEXOL 350 MG/ML SOLN COMPARISON:  Chest x-ray Mar 08, 2021 FINDINGS: Cardiovascular: Satisfactory opacification of the pulmonary arteries to the segmental level. No evidence of pulmonary embolism. Normal heart size. No pericardial effusion. Mediastinum/Nodes: No enlarged mediastinal, hilar, or axillary lymph nodes. Thyroid gland, trachea, and esophagus demonstrate no significant findings. Lungs/Pleura: Central airways are normal. No pneumothorax. Scattered subsegmental atelectasis. No suspicious nodules, masses, or focal infiltrates. Upper Abdomen: The patient is status post cholecystectomy. Intra and extrahepatic biliary duct dilatation identified. No other acute abnormalities are identified. Musculoskeletal: No chest wall abnormality. No acute or significant osseous findings. Review of the MIP images confirms the above findings. IMPRESSION: 1. No pulmonary emboli identified. 2. Mild intra and extrahepatic biliary duct prominence/dilatation is identified. The significance of this finding is unclear given cholecystectomy on January 24, 2021. Recommend correlation with labs/bilirubin levels. If there is concern for obstruction, MRCP or ERCP could better evaluate. Electronically Signed   By: Gerome Sam III M.D   On: 03/08/2021 10:39   MR 3D Recon At Scanner  Result Date: 03/08/2021 CLINICAL DATA:  Cholecystectomy 01/24/2021. Epigastric abdominal pain. EXAM: MRI ABDOMEN WITHOUT AND WITH CONTRAST (INCLUDING MRCP) TECHNIQUE:  Multiplanar multisequence MR imaging of the abdomen was performed both before and after the administration of intravenous contrast. Heavily T2-weighted images of the biliary and pancreatic ducts were obtained, and three-dimensional MRCP images were rendered by post processing. CONTRAST:  10mL GADAVIST GADOBUTROL 1 MMOL/ML IV SOLN COMPARISON:  None. FINDINGS: Lower chest: Unremarkable Hepatobiliary: The gallbladder is absent. Minimal susceptibility artifact and fluid is seen within the gallbladder fossa, in keeping with given history of recent cholecystectomy. The liver is otherwise unremarkable. No intra or extrahepatic biliary ductal dilation. However, a 5 mm x 2 mm rounded calculus is seen within the distal common duct (image # 18/series # 3). There is enhancement of the distal bile duct in keeping with inflammatory change. A trace amount of a peribiliary edema is present along the distal common duct. Pancreas:  Unremarkable Spleen:  Unremarkable Adrenals/Urinary Tract: The adrenal glands are unremarkable. The kidneys are normal. Stomach/Bowel: The visualized bowel is unremarkable. Vascular/Lymphatic: No pathologic adenopathy within the abdomen. The abdominal vasculature is unremarkable. Other:  None. Musculoskeletal: No suspicious bone lesions identified. IMPRESSION: Choledocholithiasis with retained 5 mm x 2 mm calculus within the distal common duct. Inflammatory changes  in keeping with cholangitis with biliary enhancement and trace peribiliary edema. No intra or extrahepatic biliary ductal dilation. Status post cholecystectomy. These results will be called to the ordering clinician or representative by the Radiologist Assistant, and communication documented in the PACS or [employer]. Electronically Signed   By: Helyn Numbers MD   On: 03/08/2021 23:43   US Venous Img Lower Bilateral (DVT)  Result Date: 03/08/2021 CLINICAL DATA:  Positive D-dimer level. EXAM: BILATERAL LOWER EXTREMITY VENOUS DOPPLER  ULTRASOUND TECHNIQUE: Gray-scale sonography with graded compression, as well as color Doppler and duplex ultrasound were performed to evaluate the lower extremity deep venous systems from the level of the common femoral vein and including the common femoral, femoral, profunda femoral, popliteal and calf veins including the posterior tibial, peroneal and gastrocnemius veins when visible. The superficial great saphenous vein was also interrogated. Spectral Doppler was utilized to evaluate flow at rest and with distal augmentation maneuvers in the common femoral, femoral and popliteal veins. COMPARISON:  None. FINDINGS: RIGHT LOWER EXTREMITY Common Femoral Vein: No evidence of thrombus. Normal compressibility, respiratory phasicity and response to augmentation. Saphenofemoral Junction: No evidence of thrombus. Normal compressibility and flow on color Doppler imaging. Profunda Femoral Vein: No evidence of thrombus. Normal compressibility and flow on color Doppler imaging. Femoral Vein: No evidence of thrombus. Normal compressibility, respiratory phasicity and response to augmentation. Popliteal Vein: No evidence of thrombus. Normal compressibility, respiratory phasicity and response to augmentation. Calf Veins: No evidence of thrombus. Normal compressibility and flow on color Doppler imaging. Venous Reflux:  None. Other Findings:  None. LEFT LOWER EXTREMITY Common Femoral Vein: No evidence of thrombus. Normal compressibility, respiratory phasicity and response to augmentation. Saphenofemoral Junction: No evidence of thrombus. Normal compressibility and flow on color Doppler imaging. Profunda Femoral Vein: No evidence of thrombus. Normal compressibility and flow on color Doppler imaging. Femoral Vein: No evidence of thrombus. Normal compressibility, respiratory phasicity and response to augmentation. Popliteal Vein: No evidence of thrombus. Normal compressibility, respiratory phasicity and response to augmentation. Calf  Veins: No evidence of thrombus. Normal compressibility and flow on color Doppler imaging. Venous Reflux:  None. Other Findings:  None. IMPRESSION: No evidence of deep venous thrombosis in either lower extremity. Electronically Signed   By: Lupita Raider M.D.   On: 03/08/2021 16:56   MR ABDOMEN MRCP W WO CONTAST  Result Date: 03/08/2021 CLINICAL DATA:  Cholecystectomy 01/24/2021. Epigastric abdominal pain. EXAM: MRI ABDOMEN WITHOUT AND WITH CONTRAST (INCLUDING MRCP) TECHNIQUE: Multiplanar multisequence MR imaging of the abdomen was performed both before and after the administration of intravenous contrast. Heavily T2-weighted images of the biliary and pancreatic ducts were obtained, and three-dimensional MRCP images were rendered by post processing. CONTRAST:  29mL GADAVIST GADOBUTROL 1 MMOL/ML IV SOLN COMPARISON:  None. FINDINGS: Lower chest: Unremarkable Hepatobiliary: The gallbladder is absent. Minimal susceptibility artifact and fluid is seen within the gallbladder fossa, in keeping with given history of recent cholecystectomy. The liver is otherwise unremarkable. No intra or extrahepatic biliary ductal dilation. However, a 5 mm x 2 mm rounded calculus is seen within the distal common duct (image # 18/series # 3). There is enhancement of the distal bile duct in keeping with inflammatory change. A trace amount of a peribiliary edema is present along the distal common duct. Pancreas:  Unremarkable Spleen:  Unremarkable Adrenals/Urinary Tract: The adrenal glands are unremarkable. The kidneys are normal. Stomach/Bowel: The visualized bowel is unremarkable. Vascular/Lymphatic: No pathologic adenopathy within the abdomen. The abdominal vasculature is unremarkable. Other:  None.  Musculoskeletal: No suspicious bone lesions identified. IMPRESSION: Choledocholithiasis with retained 5 mm x 2 mm calculus within the distal common duct. Inflammatory changes in keeping with cholangitis with biliary enhancement and trace  peribiliary edema. No intra or extrahepatic biliary ductal dilation. Status post cholecystectomy. These results will be called to the ordering clinician or representative by the Radiologist Assistant, and communication documented in the PACS or [employer]. Electronically Signed   By: Helyn Numbers MD   On: 03/08/2021 23:43      Impression / Plan:   Assessment: Principal Problem:   Acute pancreatitis Active Problems:   Abnormal LFTs   Hypertension   Leukocytosis   Positive D dimer   Jasmine Sanchez is a 25 y.o. y/o female with choledocholithiasis and gallstone pancreatitis status post laparoscopic cholecystectomy on April 9 of this year.  Plan:  This patient has gallstone pancreatitis with improving LFTs and the bilirubin slowly coming down.  The patient has been explained that there is a need for an ERCP to remove the stone.  The patient has been explained the procedure including risk benefits and alternatives.  The patient has been told that her pancreatitis can get worse and pancreatitis can be deadly.  She is also been told of the risks of bleeding.  The patient has agreed to undergo the procedure and will have the procedure set up for today.  Thank you for involving me in the care of this patient.      LOS: 1 day   Midge Minium, MD, [facility] 03/09/2021, 12:58 PM,  Pager [phone] 7am-5pm  Check AMION for 5pm -7am coverage and on weekends   Note: This dictation was prepared with Dragon dictation along with smaller phrase technology. Any transcriptional errors that result from this process are unintentional.

## 2021-03-10 ENCOUNTER — Encounter: Payer: Self-pay | Admitting: Gastroenterology

## 2021-03-10 LAB — COMPREHENSIVE METABOLIC PANEL
ALT: 191 U/L — ABNORMAL HIGH (ref 0–44)
AST: 76 U/L — ABNORMAL HIGH (ref 15–41)
Albumin: 3.7 g/dL (ref 3.5–5.0)
Alkaline Phosphatase: 643 U/L — ABNORMAL HIGH (ref 38–126)
Anion gap: 8 (ref 5–15)
BUN: 7 mg/dL (ref 6–20)
CO2: 23 mmol/L (ref 22–32)
Calcium: 9.2 mg/dL (ref 8.9–10.3)
Chloride: 109 mmol/L (ref 98–111)
Creatinine, Ser: 0.53 mg/dL (ref 0.44–1.00)
GFR, Estimated: >60 mL/min (ref >60)
Glucose, Bld: 128 mg/dL — ABNORMAL HIGH (ref 70–99)
Potassium: 4 mmol/L (ref 3.5–5.1)
Sodium: 140 mmol/L (ref 135–145)
Total Bilirubin: 1.8 mg/dL — ABNORMAL HIGH (ref 0.3–1.2)
Total Protein: 7.5 g/dL (ref 6.5–8.1)

## 2021-03-10 LAB — IGG 4: IgG, Subclass 4: 22 mg/dL (ref 2–96)

## 2021-03-10 LAB — GLUCOSE, CAPILLARY: Glucose-Capillary: 134 mg/dL — ABNORMAL HIGH (ref 70–99)

## 2021-03-10 LAB — MAGNESIUM: Magnesium: 1.9 mg/dL (ref 1.7–2.4)

## 2021-03-10 NOTE — Discharge Instructions (Signed)

## 2021-03-10 NOTE — Progress Notes (Signed)
Pt refused IV antibiotics, educated on the importance of needing them, pt still refused, MD is aware.

## 2021-03-10 NOTE — Progress Notes (Signed)
Vss, IV site clean, dry, and intact. Discharge packet discussed with pt and husband at bedside. Both verbalized understanding of instructions. Pt transported via W/C with all belongings to personal car.

## 2021-03-10 NOTE — Discharge Summary (Signed)
Physician Discharge Summary  Patient ID: Timisha Mondry MRN: [id number] DOB/AGE: 11-Jun-1996 25 y.o.  Admit date: 03/08/2021 Discharge date: 03/10/2021  Admission Diagnoses:  Discharge Diagnoses:  Principal Problem:   Acute pancreatitis Active Problems:   Abnormal LFTs   Hypertension   Leukocytosis   Positive D dimer   Choledocholithiasis   Discharged Condition: good  Hospital Course:  Taelynn Gurganusis a 25 y.o.femalewith medical history significant ofhypertension, former smoker, migraine headache, s/p for cholecystectomy 01/24/2021, who presents with abdominal pain.  Patient has an elevated lipase, bilirubin 5.0.  CT abdomen/pelvis showed.  Ductal dilation.  MRCP showed stone in the common bile duct.  Patient has been seen by GI for ERCP.  #1.  Acute pancreatitis secondary to common bile duct obstruction. Common bile duct stone with obstruction. S/P ERCP 5/23 Condition improved, lipase normalized.  Patient medically stable to be discharged.  2.  Morbid obesity  3.  Essential hypertension.   4.  Hypokalemia Repleted through IV.  Consults: GI  Significant Diagnostic Studies:  MRI ABDOMEN WITHOUT AND WITH CONTRAST (INCLUDING MRCP)  TECHNIQUE: Multiplanar multisequence MR imaging of the abdomen was performed both before and after the administration of intravenous contrast. Heavily T2-weighted images of the biliary and pancreatic ducts were obtained, and three-dimensional MRCP images were rendered by post processing.  CONTRAST:  74mL GADAVIST GADOBUTROL 1 MMOL/ML IV SOLN  COMPARISON:  None.  FINDINGS: Lower chest: Unremarkable  Hepatobiliary: The gallbladder is absent. Minimal susceptibility artifact and fluid is seen within the gallbladder fossa, in keeping with given history of recent cholecystectomy. The liver is otherwise unremarkable. No intra or extrahepatic biliary ductal dilation. However, a 5 mm x 2 mm rounded calculus is seen within the  distal common duct (image # 18/series # 3). There is enhancement of the distal bile duct in keeping with inflammatory change. A trace amount of a peribiliary edema is present along the distal common duct.  Pancreas:  Unremarkable  Spleen:  Unremarkable  Adrenals/Urinary Tract: The adrenal glands are unremarkable. The kidneys are normal.  Stomach/Bowel: The visualized bowel is unremarkable.  Vascular/Lymphatic: No pathologic adenopathy within the abdomen. The abdominal vasculature is unremarkable.  Other:  None.  Musculoskeletal: No suspicious bone lesions identified.  IMPRESSION: Choledocholithiasis with retained 5 mm x 2 mm calculus within the distal common duct. Inflammatory changes in keeping with cholangitis with biliary enhancement and trace peribiliary edema. No intra or extrahepatic biliary ductal dilation.  Status post cholecystectomy.  These results will be called to the ordering clinician or representative by the Radiologist Assistant, and communication documented in the PACS or [employer].   Electronically Signed   By: Helyn Numbers MD   On: 03/08/2021 23:43    Treatments: ERCP  Discharge Exam: Blood pressure 118/70, pulse 69, temperature 98.4 F (36.9 C), temperature source Oral, resp. rate 18, height 5\' 9"  (1.753 m), weight 134.7 kg, last menstrual period 02/06/2021, SpO2 96 %. General appearance: alert and cooperative Resp: clear to auscultation bilaterally Cardio: regular rate and rhythm, S1, S2 normal, no murmur, click, rub or gallop GI: soft, non-tender; bowel sounds normal; no masses,  no organomegaly Extremities: extremities normal, atraumatic, no cyanosis or edema  Disposition: Discharge disposition: 01-Home or Self Care       Discharge Instructions    Diet - low sodium heart healthy   Complete by: As directed    Increase activity slowly   Complete by: As directed      Allergies as of 03/10/2021      Reactions  Amoxil [amoxicillin] Anaphylaxis   Penicillins Anaphylaxis   Ginger Swelling   Throat swelling   Latex    rash   Tape    Reports only can have paper tape      Medication List    STOP taking these medications   oxyCODONE 5 MG immediate release tablet Commonly known as: Roxicodone     TAKE these medications   docusate sodium 100 MG capsule Commonly known as: Colace Take 1 tablet once or twice daily as needed for constipation while taking narcotic pain medicine   ibuprofen 800 MG tablet Commonly known as: ADVIL Take 1 tablet (800 mg total) by mouth every 8 (eight) hours as needed.   ondansetron 4 MG disintegrating tablet Commonly known as: Zofran ODT Allow 1-2 tablets to dissolve in your mouth every 8 hours as needed for nausea/vomiting       Follow-up Information    Wyline Mood, MD Follow up in 2 week(s).   Specialty: Gastroenterology Contact information: [address] [place] [address] [phone]               Signed: Marrion Coy 03/10/2021, 9:20 AM

## 2021-03-10 NOTE — TOC Transition Note (Signed)
Transition of Care [facility]) - CM/SW Discharge Note   Patient Details  Name: Jasmine Sanchez MRN: [id number] Date of Birth: September 16, 1996  [facility] CM/SW Contact:  Caryn Section, RN Phone Number: 03/10/2021, 10:31 AM   Clinical Narrative:   Patient is discharging home today.  TOC asked for meds to go to medication management as patient has no insurance.  Per MD, patient has no new meds.    Patient has paperwork for [facility] and [facility] has made referral through portal.  Patient states she has no other needs from [facility] at this time.          Patient Goals and CMS Choice        Discharge Placement                       Discharge Plan and Services                                     Social Determinants of Health (SDOH) Interventions     Readmission Risk Interventions No flowsheet data found.

## 2021-03-10 NOTE — Progress Notes (Signed)
Vss, IV catheter removed, site is clean, dry and intact. Discharge packet discussed with pt an d husband at bedside. Both verbalized understanding of instructions  Pt transported via W/C with all belongings to personal car.

## 2021-03-13 LAB — CULTURE, BLOOD (ROUTINE X 2)
Culture: NO GROWTH
Culture: NO GROWTH

## 2021-04-07 ENCOUNTER — Encounter: Payer: Self-pay | Admitting: *Deleted

## 2021-04-07 ENCOUNTER — Ambulatory Visit: Payer: Medicaid Other | Admitting: Gastroenterology

## 2021-04-07 NOTE — Progress Notes (Deleted)
Wyline Mood MD, MRCP(U.K) [address]  [address]  [place], [place] [address]  Main: [phone]  Fax: [phone]   Primary Care Physician: Patient, No Pcp Per (Inactive)  Primary Gastroenterologist:  Dr. Wyline Mood   Chief complaint : Hospital follow-up   HPI: Jasmine Sanchez is a 25 y.o. female   Summary of history :  She was admitted on 03/08/2021 with acute pancreatitis confirmed on CT scan of the abdomen.  Mild intra and extrahepatic biliary dilation was noted.  Total bilirubin was 5.0 with elevated alkaline phosphatase, AST and ALT.  An MRCP was performed which showed choledocholithiasis.  And subsequently underwent ERCP by Dr. Servando Snare on 03/09/2021 Sphincterotomy was performed.  Balloon extraction of stone was performed.  She is status postcholecystectomy and she was discharged the next day.   Interval history   ***/***/2020-  ***/***/2020    Current Outpatient Medications  Medication Sig Dispense Refill   docusate sodium (COLACE) 100 MG capsule Take 1 tablet once or twice daily as needed for constipation while taking narcotic pain medicine (Patient not taking: Reported on 03/08/2021) 30 capsule 0   ibuprofen (ADVIL) 800 MG tablet Take 1 tablet (800 mg total) by mouth every 8 (eight) hours as needed. 90 tablet 1   ondansetron (ZOFRAN ODT) 4 MG disintegrating tablet Allow 1-2 tablets to dissolve in your mouth every 8 hours as needed for nausea/vomiting (Patient not taking: Reported on 03/08/2021) 30 tablet 0   No current facility-administered medications for this visit.    Allergies as of 04/07/2021 - Review Complete 03/09/2021  Allergen Reaction Noted   Amoxil [amoxicillin] Anaphylaxis 03/08/2021   Penicillins Anaphylaxis 11/21/2020   Ginger Swelling 03/09/2021   Latex  01/24/2021   Tape  03/09/2021    ROS:  General: Negative for anorexia, weight loss, fever, chills, fatigue, weakness. ENT: Negative for hoarseness, difficulty swallowing , nasal  congestion. CV: Negative for chest pain, angina, palpitations, dyspnea on exertion, peripheral edema.  Respiratory: Negative for dyspnea at rest, dyspnea on exertion, cough, sputum, wheezing.  GI: See history of present illness. GU:  Negative for dysuria, hematuria, urinary incontinence, urinary frequency, nocturnal urination.  Endo: Negative for unusual weight change.    Physical Examination:   There were no vitals taken for this visit.  General: Well-nourished, well-developed in no acute distress.  Eyes: No icterus. Conjunctivae pink. Mouth: Oropharyngeal mucosa moist and pink , no lesions erythema or exudate. Lungs: Clear to auscultation bilaterally. Non-labored. Heart: Regular rate and rhythm, no murmurs rubs or gallops.  Abdomen: Bowel sounds are normal, nontender, nondistended, no hepatosplenomegaly or masses, no abdominal bruits or hernia , no rebound or guarding.   Extremities: No lower extremity edema. No clubbing or deformities. Neuro: Alert and oriented x 3.  Grossly intact. Skin: Warm and dry, no jaundice.   Psych: Alert and cooperative, normal mood and affect.   Imaging Studies: CT ABDOMEN PELVIS WO CONTRAST  Result Date: 03/08/2021 CLINICAL DATA:  Upper abdominal pain. History of cholecystectomy on 01/24/2021 EXAM: CT ABDOMEN AND PELVIS WITHOUT CONTRAST TECHNIQUE: Multidetector CT imaging of the abdomen and pelvis was performed following the standard protocol without IV contrast. COMPARISON:  None. FINDINGS: Lower chest: Included lung bases are clear.  Heart size is normal. Hepatobiliary: Status post cholecystectomy. No fluid collections within the gallbladder fossa. Mild intra and extrahepatic biliary ductal dilatation is seen, which may be related to post cholecystectomy changes. No hyperdense stone is seen within the common bile duct. Liver has an otherwise unremarkable unenhanced  appearance. No focal liver lesion identified. Pancreas: Very subtle peripancreatic fat  stranding adjacent to the pancreatic head and proximal body (series 2, image 30). No peripancreatic fluid or fluid collections. No pancreatic ductal dilatation. Spleen: Normal in size without focal abnormality. Adrenals/Urinary Tract: Unremarkable adrenal glands. Excreted contrast is present within the bilateral renal collecting systems and urinary bladder. Kidneys have an unremarkable unenhanced appearance. No evidence of renal lesion, stone, or hydronephrosis. No urinary bladder wall thickening or filling defects. Stomach/Bowel: Stomach is within normal limits. Appendix appears normal (series 2, image 64). No evidence of bowel wall thickening, distention, or inflammatory changes. Vascular/Lymphatic: No significant vascular findings are present. No enlarged abdominal or pelvic lymph nodes. Reproductive: Uterus and bilateral adnexa are unremarkable. Other: No free fluid. No abdominopelvic fluid collection. No pneumoperitoneum. No abdominal wall hernia. Musculoskeletal: No acute or significant osseous findings. IMPRESSION: 1. Very subtle peripancreatic fat stranding adjacent to the pancreatic head and proximal body may represent early acute pancreatitis. Correlation with serum lipase levels is recommended. 2. Status post cholecystectomy. No fluid collections within the gallbladder fossa. 3. Mild intra and extrahepatic biliary ductal dilatation, which may be related to post cholecystectomy changes. Electronically Signed   By: Duanne Guess D.O.   On: 03/08/2021 12:32   MR 3D Recon At Scanner  Result Date: 03/08/2021 CLINICAL DATA:  Cholecystectomy 01/24/2021. Epigastric abdominal pain. EXAM: MRI ABDOMEN WITHOUT AND WITH CONTRAST (INCLUDING MRCP) TECHNIQUE: Multiplanar multisequence MR imaging of the abdomen was performed both before and after the administration of intravenous contrast. Heavily T2-weighted images of the biliary and pancreatic ducts were obtained, and three-dimensional MRCP images were rendered  by post processing. CONTRAST:  60mL GADAVIST GADOBUTROL 1 MMOL/ML IV SOLN COMPARISON:  None. FINDINGS: Lower chest: Unremarkable Hepatobiliary: The gallbladder is absent. Minimal susceptibility artifact and fluid is seen within the gallbladder fossa, in keeping with given history of recent cholecystectomy. The liver is otherwise unremarkable. No intra or extrahepatic biliary ductal dilation. However, a 5 mm x 2 mm rounded calculus is seen within the distal common duct (image # 18/series # 3). There is enhancement of the distal bile duct in keeping with inflammatory change. A trace amount of a peribiliary edema is present along the distal common duct. Pancreas:  Unremarkable Spleen:  Unremarkable Adrenals/Urinary Tract: The adrenal glands are unremarkable. The kidneys are normal. Stomach/Bowel: The visualized bowel is unremarkable. Vascular/Lymphatic: No pathologic adenopathy within the abdomen. The abdominal vasculature is unremarkable. Other:  None. Musculoskeletal: No suspicious bone lesions identified. IMPRESSION: Choledocholithiasis with retained 5 mm x 2 mm calculus within the distal common duct. Inflammatory changes in keeping with cholangitis with biliary enhancement and trace peribiliary edema. No intra or extrahepatic biliary ductal dilation. Status post cholecystectomy. These results will be called to the ordering clinician or representative by the Radiologist Assistant, and communication documented in the PACS or [employer]. Electronically Signed   By: Helyn Numbers MD   On: 03/08/2021 23:43   US Venous Img Lower Bilateral (DVT)  Result Date: 03/08/2021 CLINICAL DATA:  Positive D-dimer level. EXAM: BILATERAL LOWER EXTREMITY VENOUS DOPPLER ULTRASOUND TECHNIQUE: Gray-scale sonography with graded compression, as well as color Doppler and duplex ultrasound were performed to evaluate the lower extremity deep venous systems from the level of the common femoral vein and including the common femoral,  femoral, profunda femoral, popliteal and calf veins including the posterior tibial, peroneal and gastrocnemius veins when visible. The superficial great saphenous vein was also interrogated. Spectral Doppler was utilized to evaluate flow at rest  and with distal augmentation maneuvers in the common femoral, femoral and popliteal veins. COMPARISON:  None. FINDINGS: RIGHT LOWER EXTREMITY Common Femoral Vein: No evidence of thrombus. Normal compressibility, respiratory phasicity and response to augmentation. Saphenofemoral Junction: No evidence of thrombus. Normal compressibility and flow on color Doppler imaging. Profunda Femoral Vein: No evidence of thrombus. Normal compressibility and flow on color Doppler imaging. Femoral Vein: No evidence of thrombus. Normal compressibility, respiratory phasicity and response to augmentation. Popliteal Vein: No evidence of thrombus. Normal compressibility, respiratory phasicity and response to augmentation. Calf Veins: No evidence of thrombus. Normal compressibility and flow on color Doppler imaging. Venous Reflux:  None. Other Findings:  None. LEFT LOWER EXTREMITY Common Femoral Vein: No evidence of thrombus. Normal compressibility, respiratory phasicity and response to augmentation. Saphenofemoral Junction: No evidence of thrombus. Normal compressibility and flow on color Doppler imaging. Profunda Femoral Vein: No evidence of thrombus. Normal compressibility and flow on color Doppler imaging. Femoral Vein: No evidence of thrombus. Normal compressibility, respiratory phasicity and response to augmentation. Popliteal Vein: No evidence of thrombus. Normal compressibility, respiratory phasicity and response to augmentation. Calf Veins: No evidence of thrombus. Normal compressibility and flow on color Doppler imaging. Venous Reflux:  None. Other Findings:  None. IMPRESSION: No evidence of deep venous thrombosis in either lower extremity. Electronically Signed   By: Lupita RaiderJames  Green Jr  M.D.   On: 03/08/2021 16:56   DG C-Arm 1-60 Min-No Report  Result Date: 03/09/2021 Fluoroscopy was utilized by the requesting physician.  No radiographic interpretation.   MR ABDOMEN MRCP W WO CONTAST  Result Date: 03/08/2021 CLINICAL DATA:  Cholecystectomy 01/24/2021. Epigastric abdominal pain. EXAM: MRI ABDOMEN WITHOUT AND WITH CONTRAST (INCLUDING MRCP) TECHNIQUE: Multiplanar multisequence MR imaging of the abdomen was performed both before and after the administration of intravenous contrast. Heavily T2-weighted images of the biliary and pancreatic ducts were obtained, and three-dimensional MRCP images were rendered by post processing. CONTRAST:  10mL GADAVIST GADOBUTROL 1 MMOL/ML IV SOLN COMPARISON:  None. FINDINGS: Lower chest: Unremarkable Hepatobiliary: The gallbladder is absent. Minimal susceptibility artifact and fluid is seen within the gallbladder fossa, in keeping with given history of recent cholecystectomy. The liver is otherwise unremarkable. No intra or extrahepatic biliary ductal dilation. However, a 5 mm x 2 mm rounded calculus is seen within the distal common duct (image # 18/series # 3). There is enhancement of the distal bile duct in keeping with inflammatory change. A trace amount of a peribiliary edema is present along the distal common duct. Pancreas:  Unremarkable Spleen:  Unremarkable Adrenals/Urinary Tract: The adrenal glands are unremarkable. The kidneys are normal. Stomach/Bowel: The visualized bowel is unremarkable. Vascular/Lymphatic: No pathologic adenopathy within the abdomen. The abdominal vasculature is unremarkable. Other:  None. Musculoskeletal: No suspicious bone lesions identified. IMPRESSION: Choledocholithiasis with retained 5 mm x 2 mm calculus within the distal common duct. Inflammatory changes in keeping with cholangitis with biliary enhancement and trace peribiliary edema. No intra or extrahepatic biliary ductal dilation. Status post cholecystectomy. These results  will be called to the ordering clinician or representative by the Radiologist Assistant, and communication documented in the PACS or [employer]. Electronically Signed   By: Helyn NumbersAshesh  Parikh MD   On: 03/08/2021 23:43    Assessment and Plan:   Amil AmenHailey Sanchez is a 25 y.o. y/o female is here today for hospital follow-up after she presented with acute biliary pancreatitis, status postcholecystectomy, choledocholithiasis noted on MRCP and subsequently ERCP with stone extraction was performed.  Plan 1.  Check LFTs to ensure  resolution.  Dr Wyline Mood  MD,MRCP [facility]) Follow up in ***

## 2022-02-09 IMAGING — US US ABDOMEN LIMITED RUQ/ASCITES
1 series · 14 of 25 positions shown · non-contrast
Comparison: November 22, 2020

CLINICAL DATA: Upper abdominal pain

EXAM:
ULTRASOUND ABDOMEN LIMITED RIGHT UPPER QUADRANT

[Series 1: us abdomen limited ruq (liver/gb) · 14 of 49 slices shown]
[im 1/49]
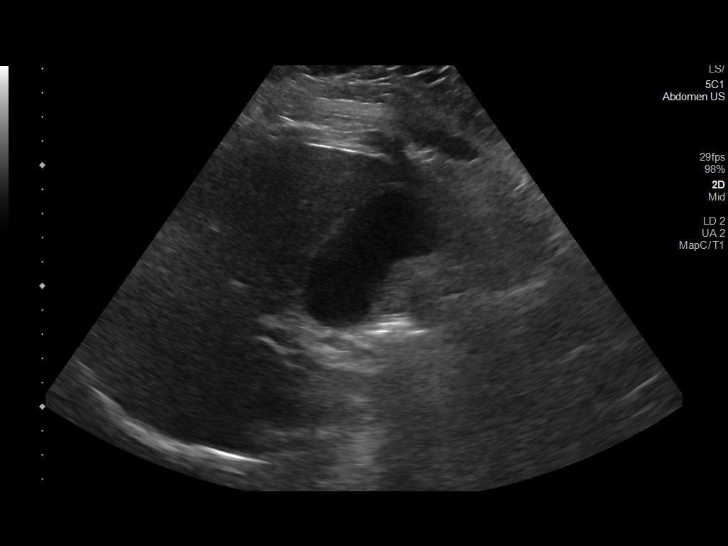
[im 5/49]
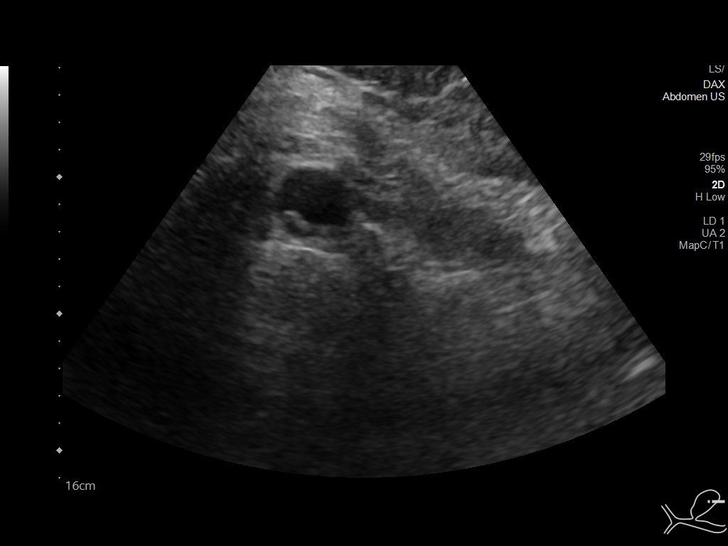
[im 9/49]
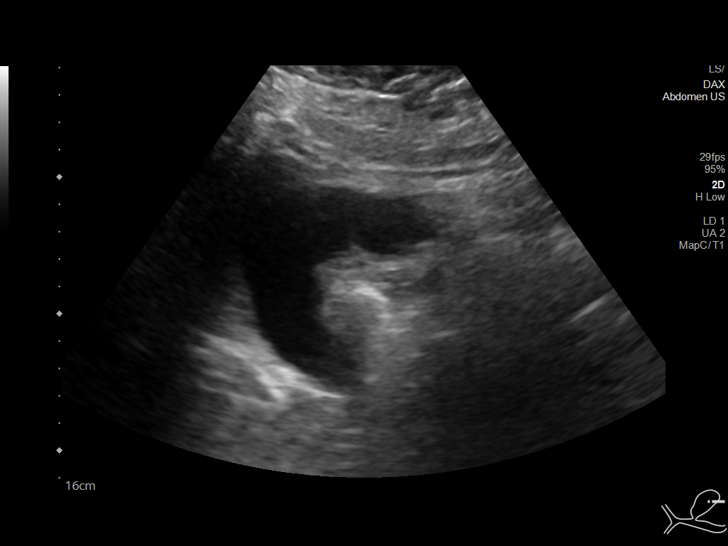
[im 13/49]
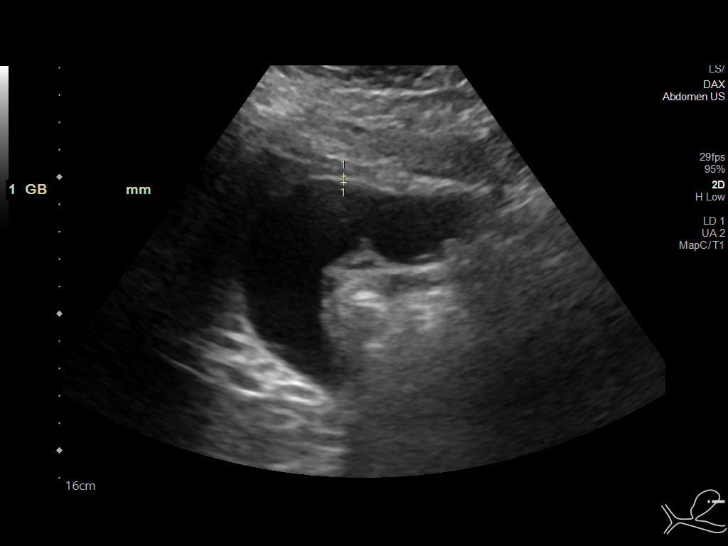
[im 17/49]
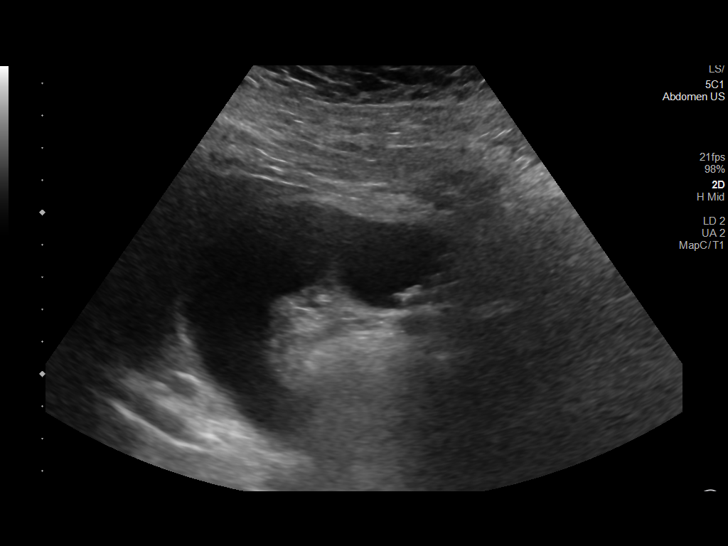
[im 19/49]
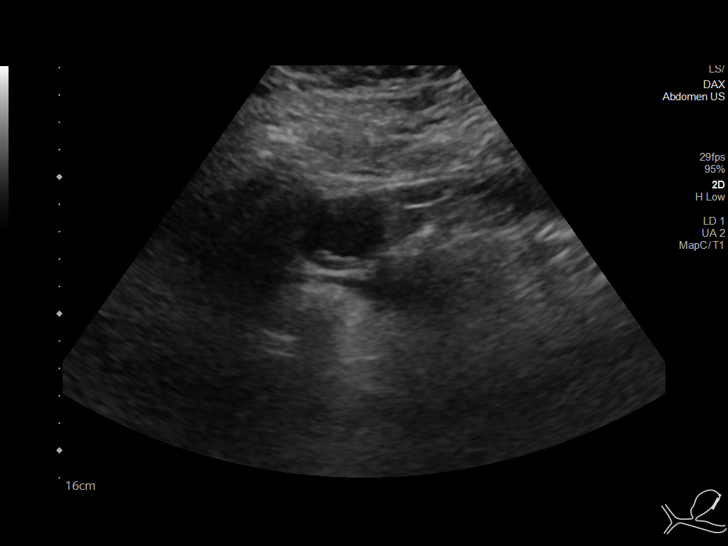
[im 23/49]
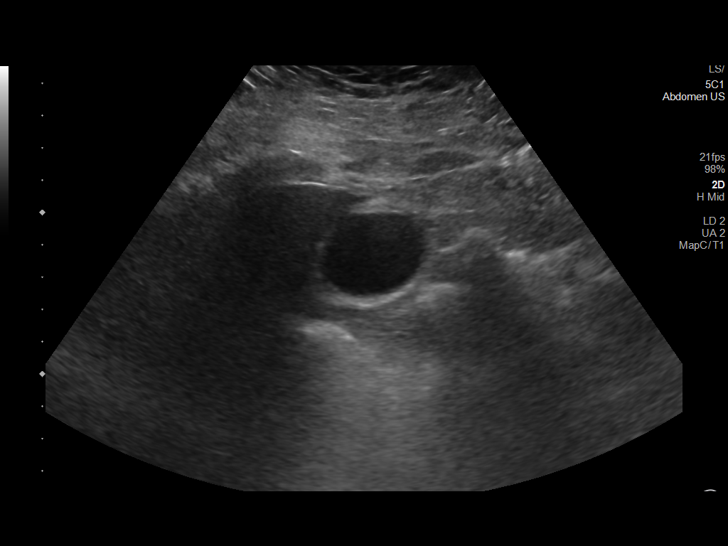
[im 27/49]
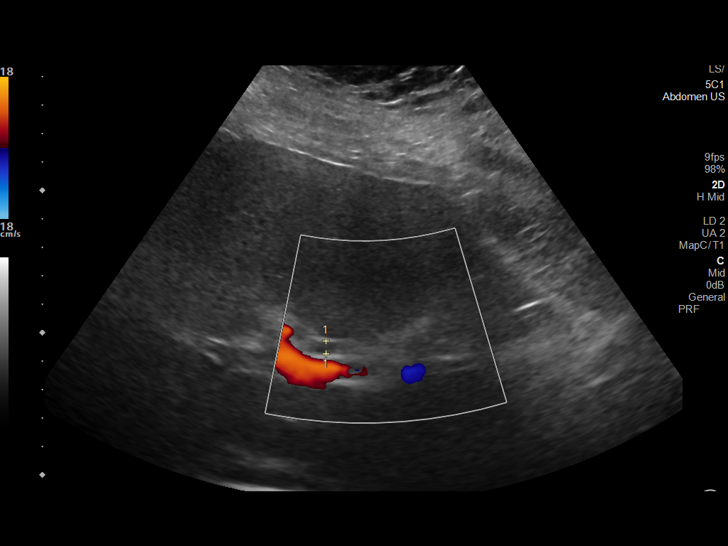
[im 31/49]
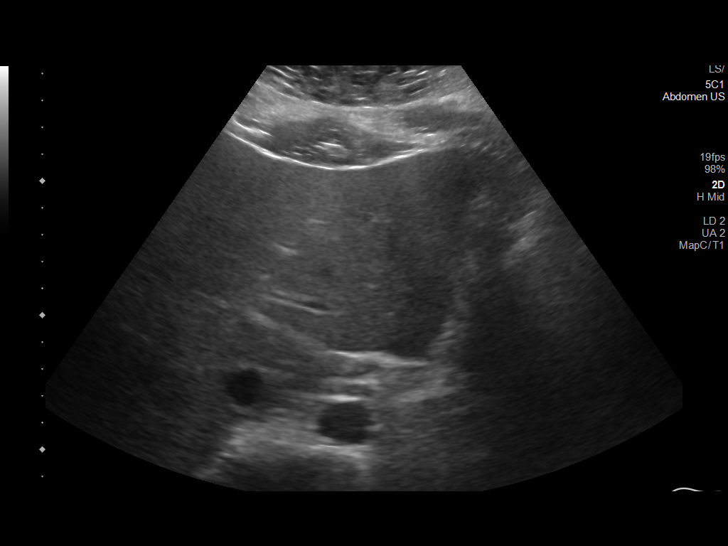
[im 33/49]
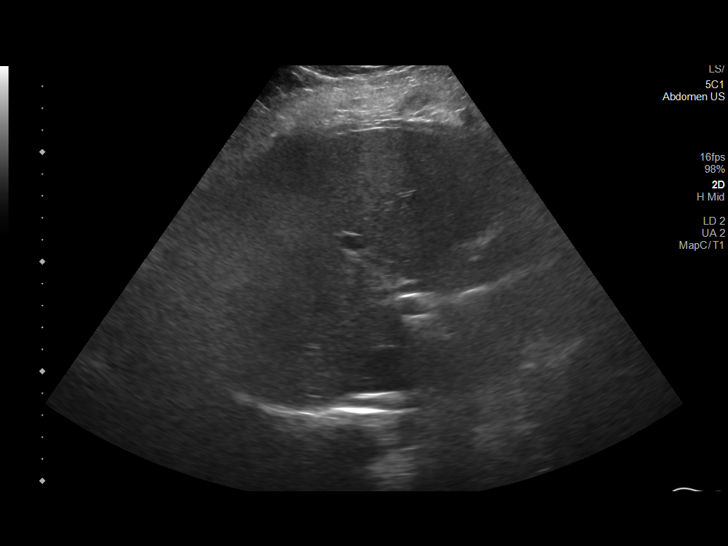
[im 37/49]
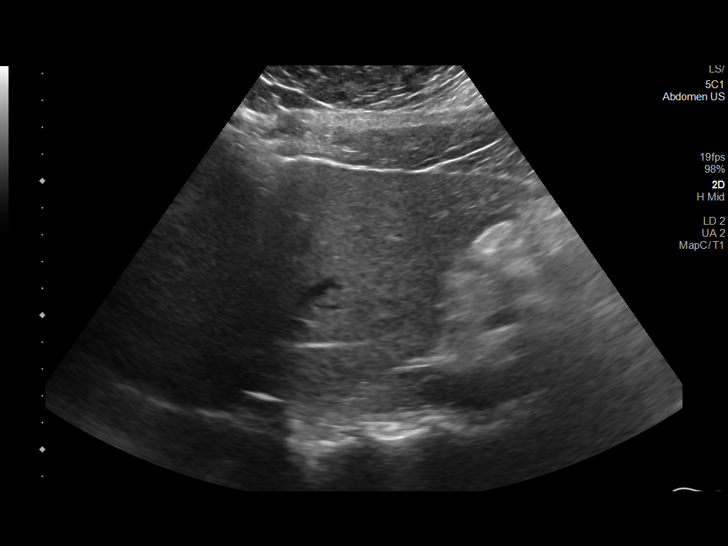
[im 41/49]
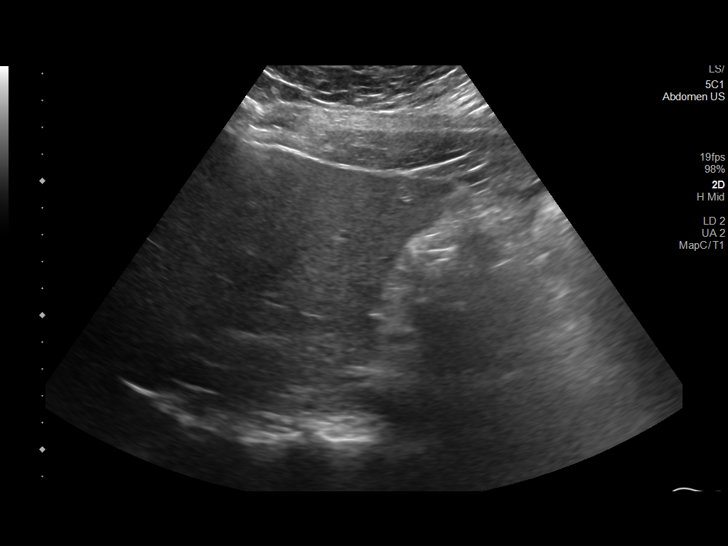
[im 45/49]
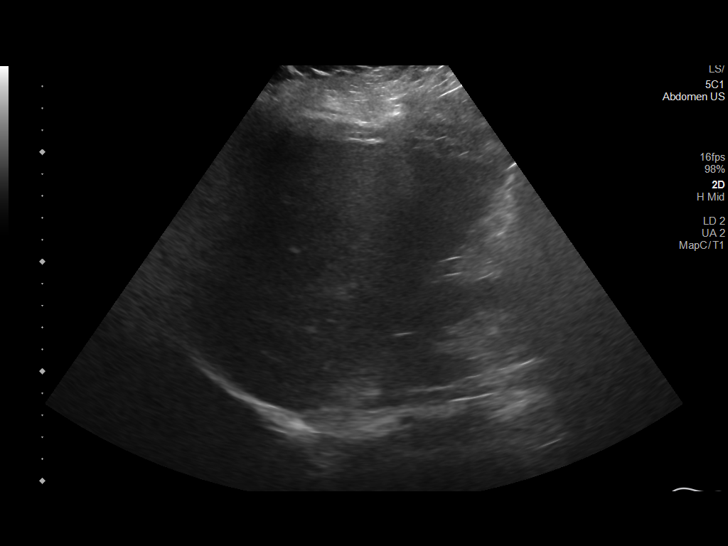
[im 49/49]
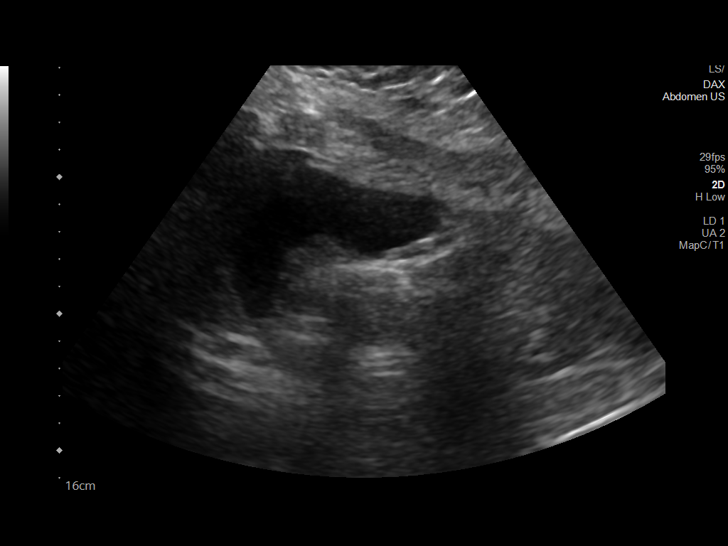

[14 of 25 positions shown; findings below may reference images not displayed]

FINDINGS: Gallbladder:

Within the gallbladder, there are multiple echogenic foci which move
and shadow consistent with cholelithiasis. Largest gallstone
measures approximately 1.5 cm. No appreciable gallbladder wall
thickening or pericholecystic fluid. No sonographic Murphy sign
noted by sonographer.

Common bile duct:

Diameter: 5 mm. No intrahepatic or extrahepatic biliary duct
dilatation.

Liver:

No focal lesion identified. Liver echogenicity overall increased.
Portal vein is patent on color Doppler imaging with normal direction
of blood flow towards the liver.

Other: None.
IMPRESSION: Cholelithiasis with multiple gallstones throughout the gallbladder.
No gallbladder wall thickening evident. No pericholecystic fluid.

Increased liver echogenicity is felt to be indicative of a degree of
hepatic steatosis. No focal liver lesions evident.

## 2022-03-24 IMAGING — CT CT ANGIO CHEST
2 of 6 series · 18 of 46 positions shown · IV contrast (APPLIED)
Comparison: Chest x-ray March 08, 2021

CLINICAL DATA: Chest pain.  Rule out pulmonary embolus.

EXAM:
CT ANGIOGRAPHY CHEST WITH CONTRAST
TECHNIQUE: Multidetector CT imaging of the chest was performed using the
standard protocol during bolus administration of intravenous
contrast. Multiplanar CT image reconstructions and MIPs were
obtained to evaluate the vascular anatomy.
CONTRAST:  75mL OMNIPAQUE IOHEXOL 350 MG/ML SOLN

[Series 6: thins · axial · 0.74mm/px · z∈[-772,-537]mm · 15 of 259 slices shown]
[im 12/259  lung]
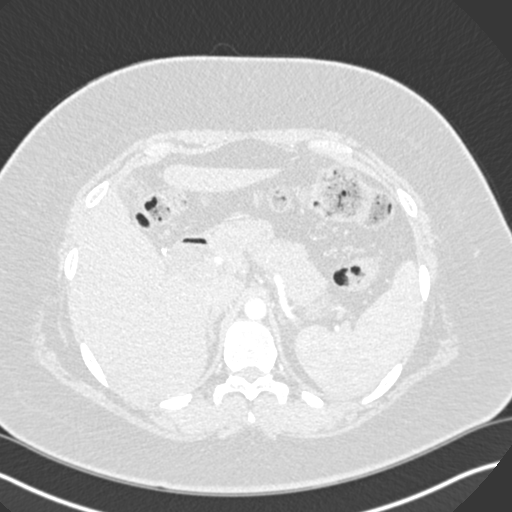
[im 34/259  soft-tissue]
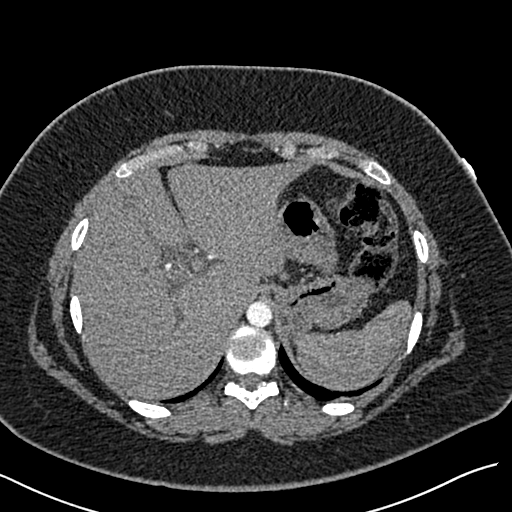
[im 45/259  lung]
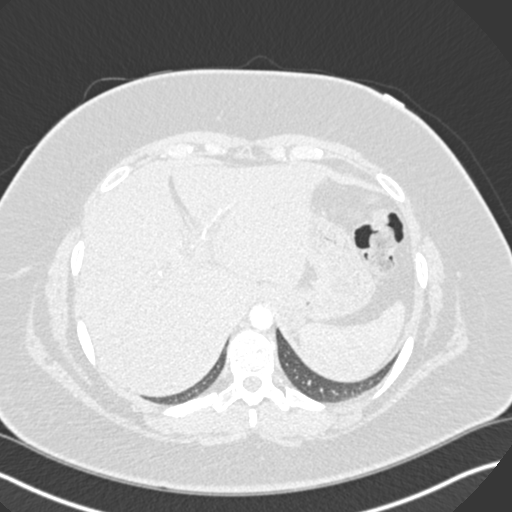
[im 68/259  soft-tissue]
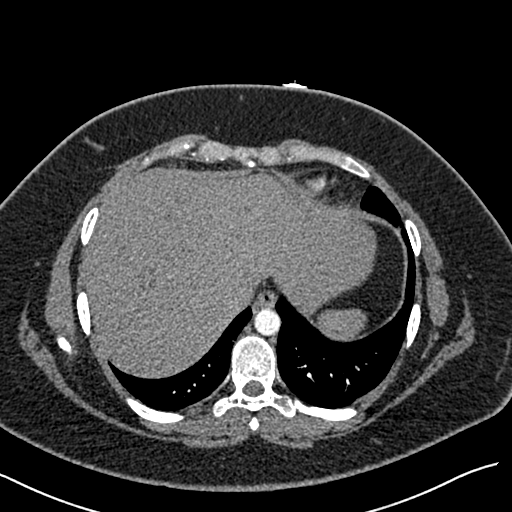
[im 79/259  lung]
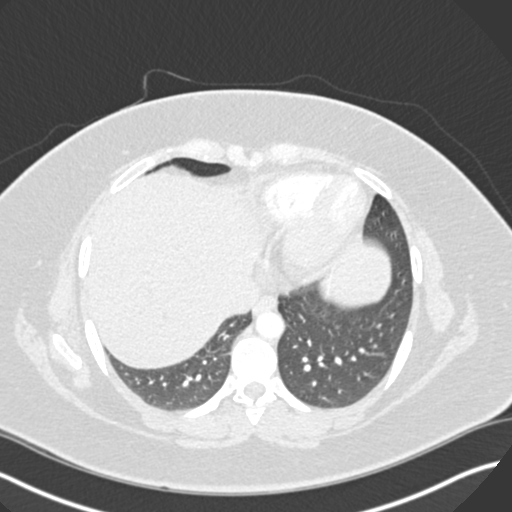
[im 101/259  soft-tissue]
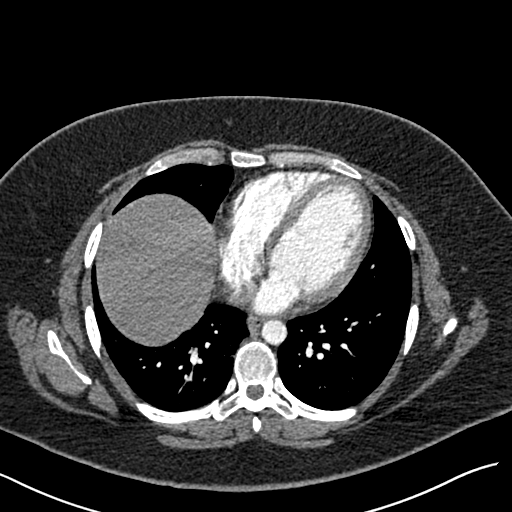
[im 113/259  lung]
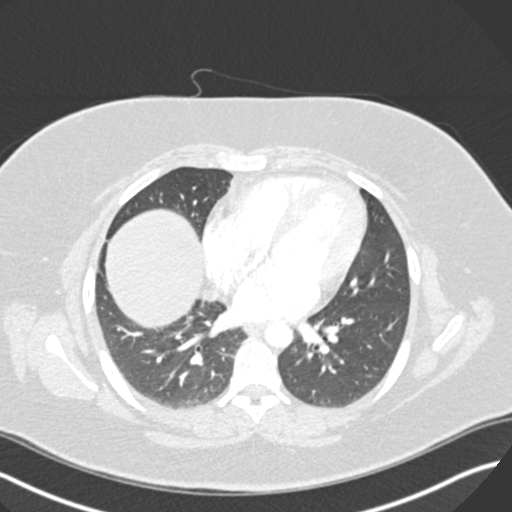
[im 135/259  soft-tissue]
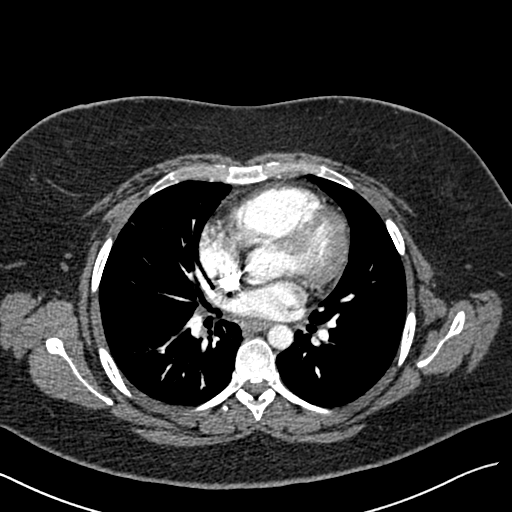
[im 146/259  lung]
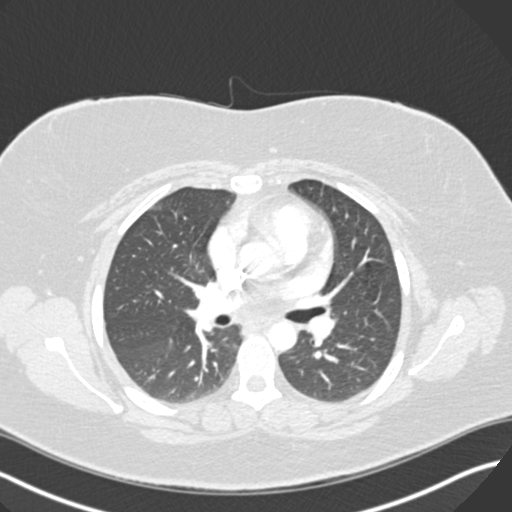
[im 158/259  soft-tissue]
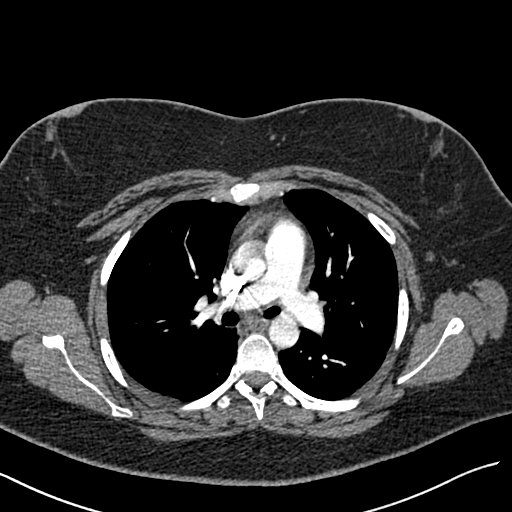
[im 180/259  lung]
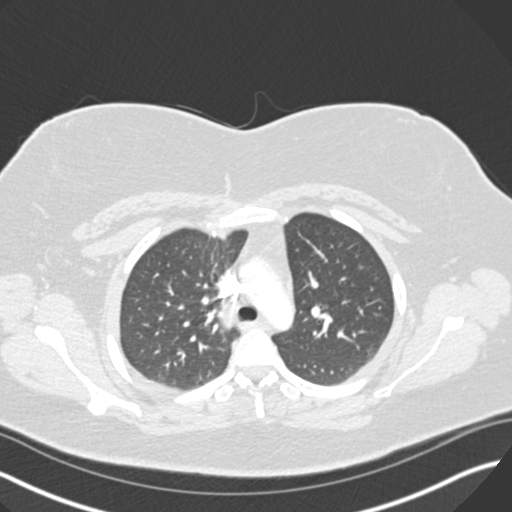
[im 191/259  soft-tissue]
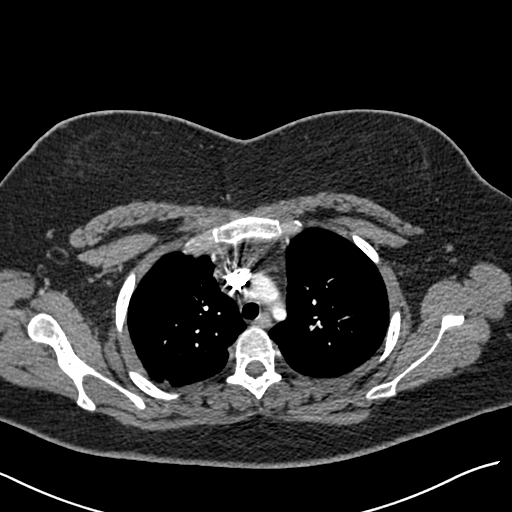
[im 214/259  lung]
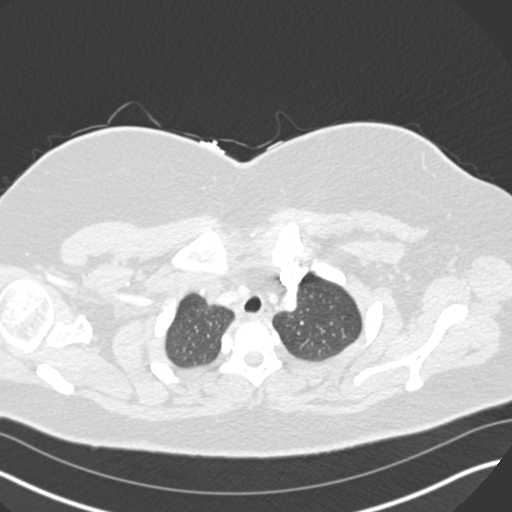
[im 225/259  soft-tissue]
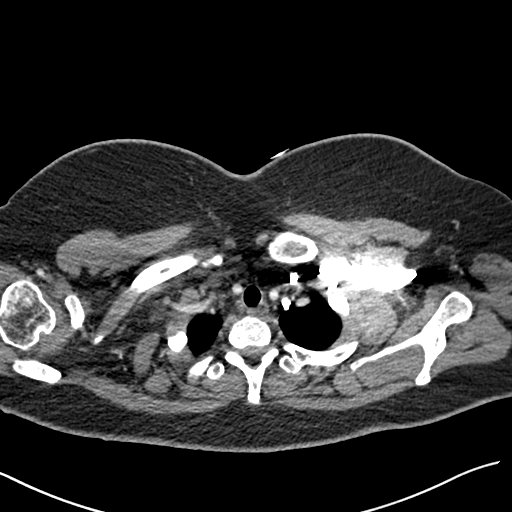
[im 247/259  lung]
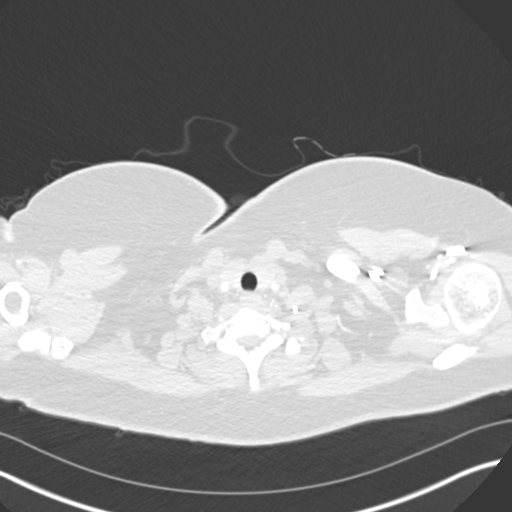

[Series 8: coronal mpr · coronal · 0.50mm/px · 3 of 105 slices shown]
[im 27/105  soft-tissue]
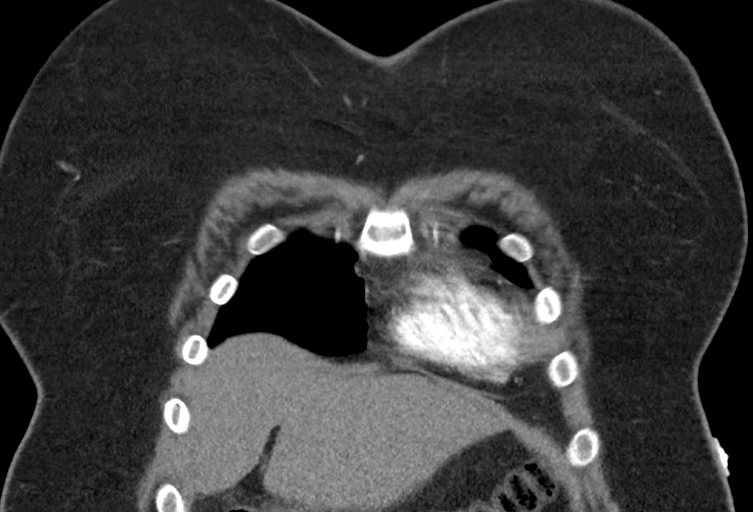
[im 53/105  soft-tissue]
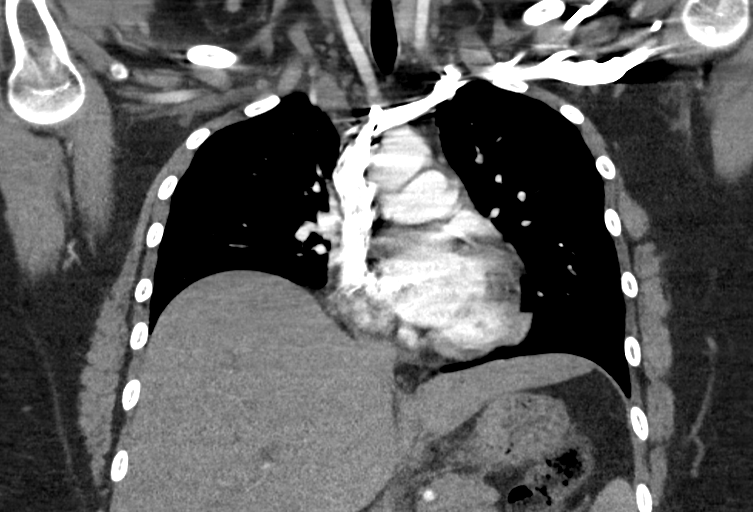
[im 79/105  soft-tissue]
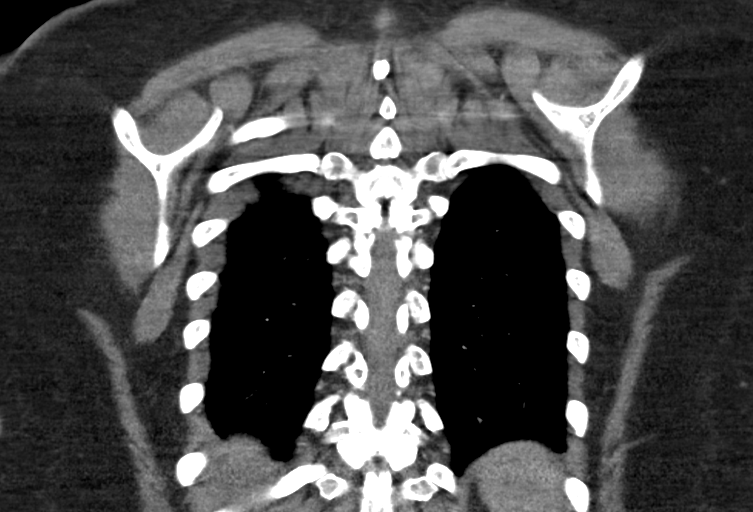

[18 of 46 positions shown; findings below may reference images not displayed]

FINDINGS: Cardiovascular: Satisfactory opacification of the pulmonary arteries
to the segmental level. No evidence of pulmonary embolism. Normal
heart size. No pericardial effusion.

Mediastinum/Nodes: No enlarged mediastinal, hilar, or axillary lymph
nodes. Thyroid gland, trachea, and esophagus demonstrate no
significant findings.

Lungs/Pleura: Central airways are normal. No pneumothorax. Scattered
subsegmental atelectasis. No suspicious nodules, masses, or focal
infiltrates.

Upper Abdomen: The patient is status post cholecystectomy. Intra and
extrahepatic biliary duct dilatation identified. No other acute
abnormalities are identified.

Musculoskeletal: No chest wall abnormality. No acute or significant
osseous findings.

Review of the MIP images confirms the above findings.
IMPRESSION: 1. No pulmonary emboli identified.
2. Mild intra and extrahepatic biliary duct prominence/dilatation is
identified. The significance of this finding is unclear given
cholecystectomy on January 24, 2021. Recommend correlation with
labs/bilirubin levels. If there is concern for obstruction, MRCP or
ERCP could better evaluate.

## 2022-03-24 IMAGING — US US EXTREM LOW VENOUS
1 series · 13 of 24 positions shown · non-contrast
Comparison: None.

CLINICAL DATA: Positive D-dimer level.



[Series 1: us venous img lower bilat (dvt) · portal-venous · 13 of 60 slices shown]
[im 1/60]
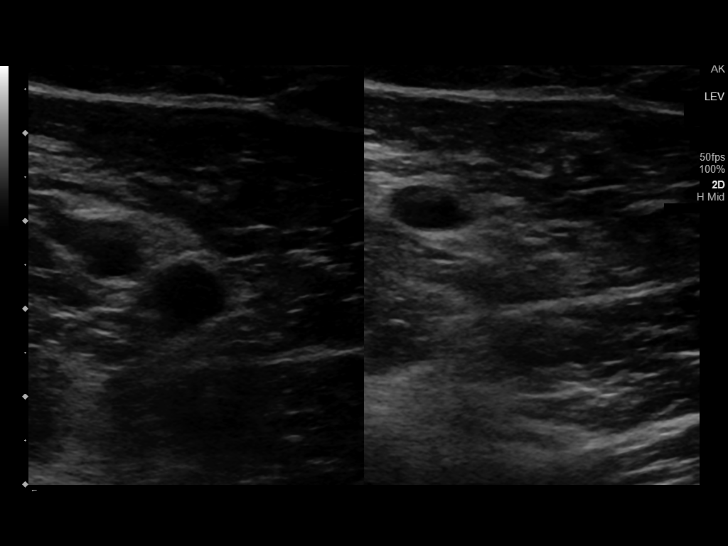
[im 6/60]
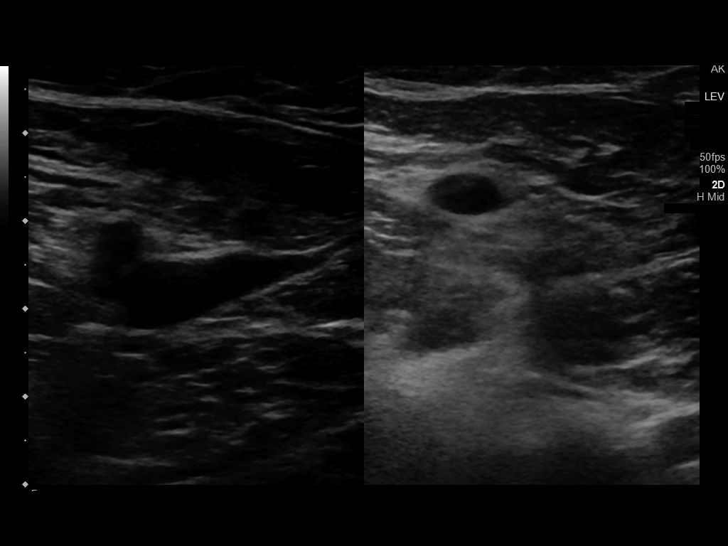
[im 11/60]
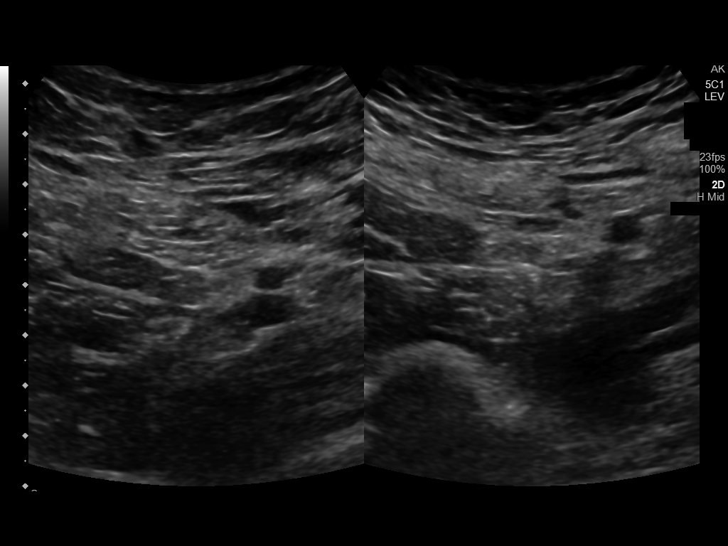
[im 16/60]
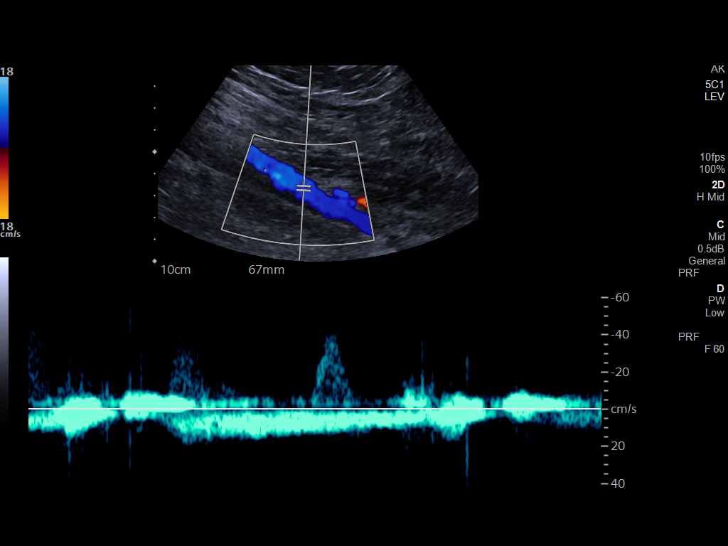
[im 21/60]
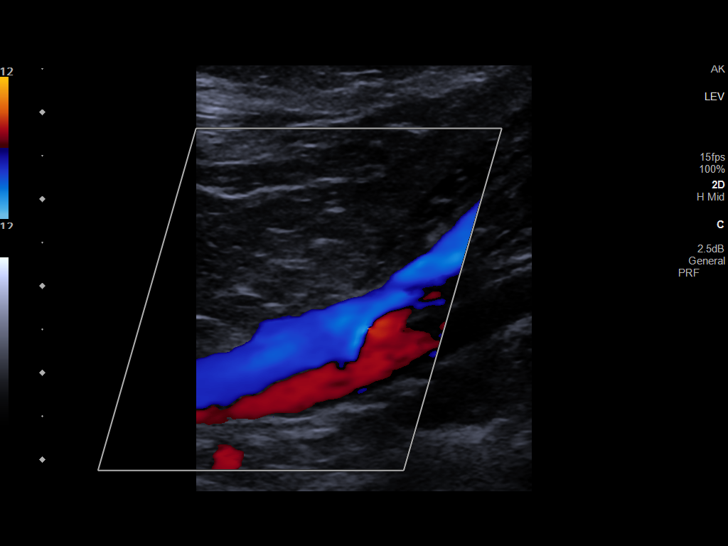
[im 26/60]
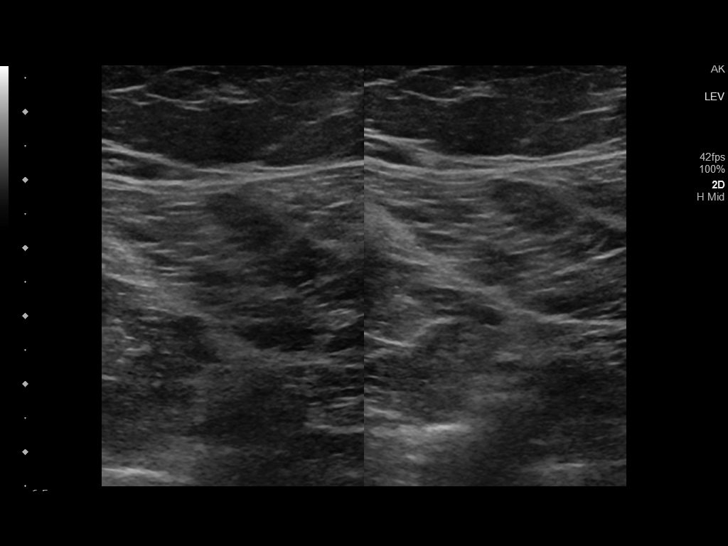
[im 31/60]
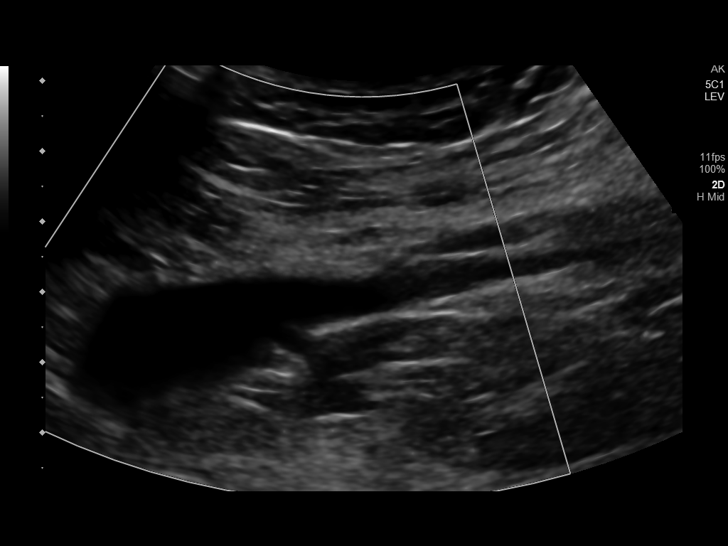
[im 34/60]
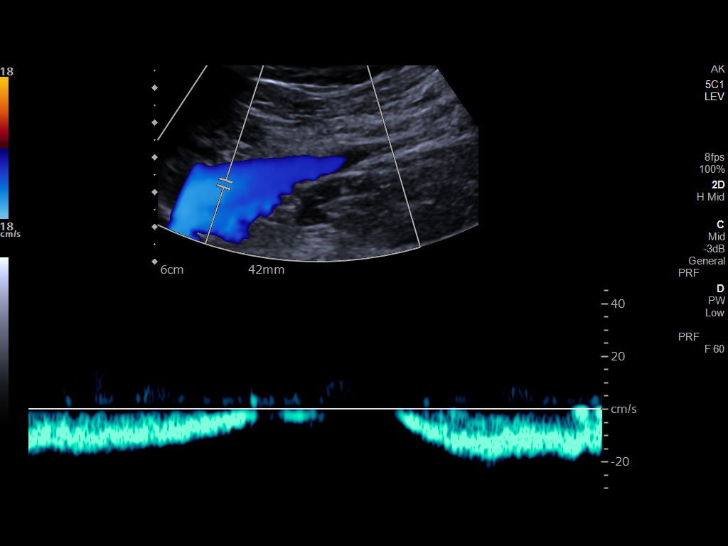
[im 39/60]
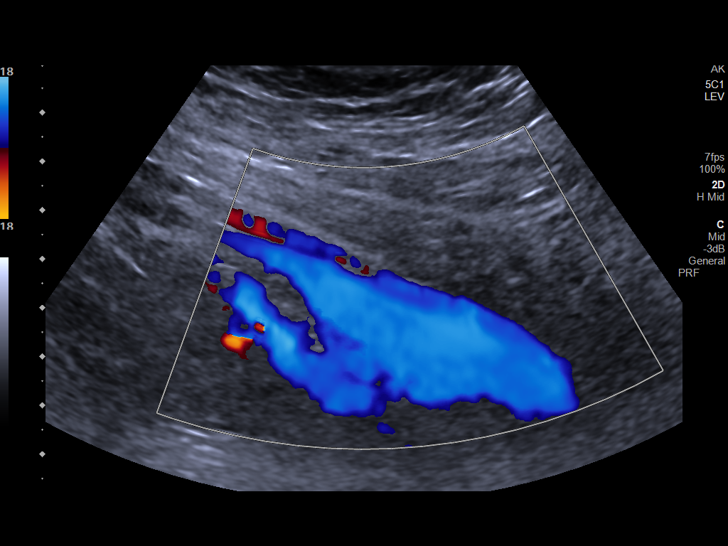
[im 44/60]
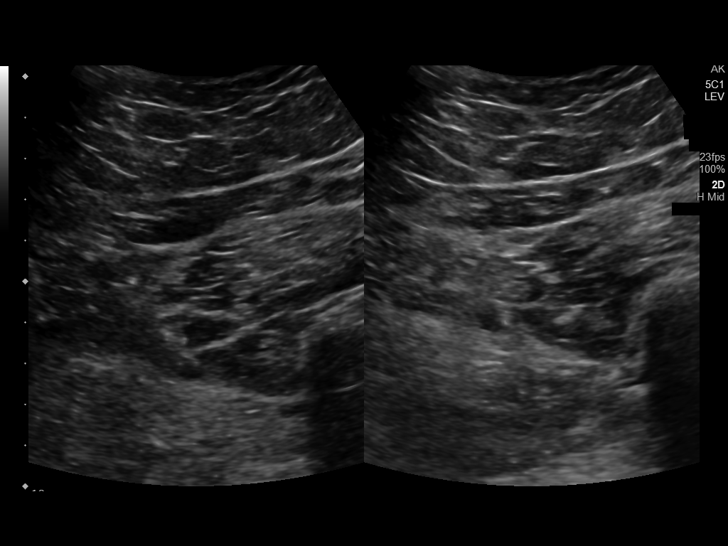
[im 49/60]
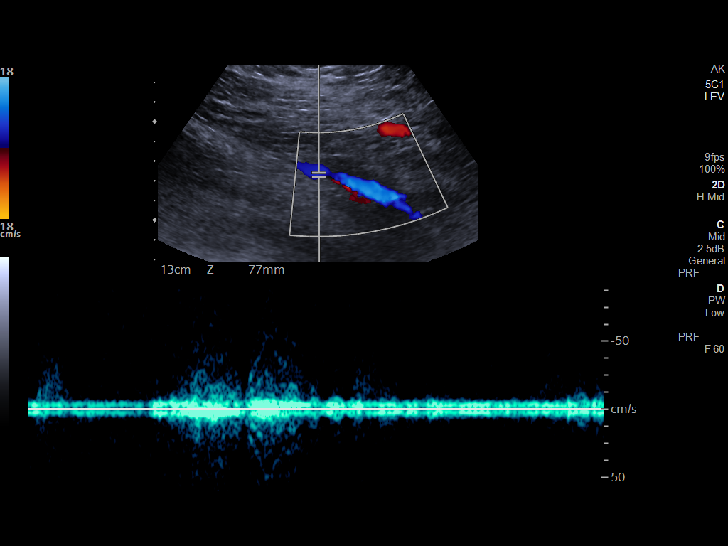
[im 54/60]
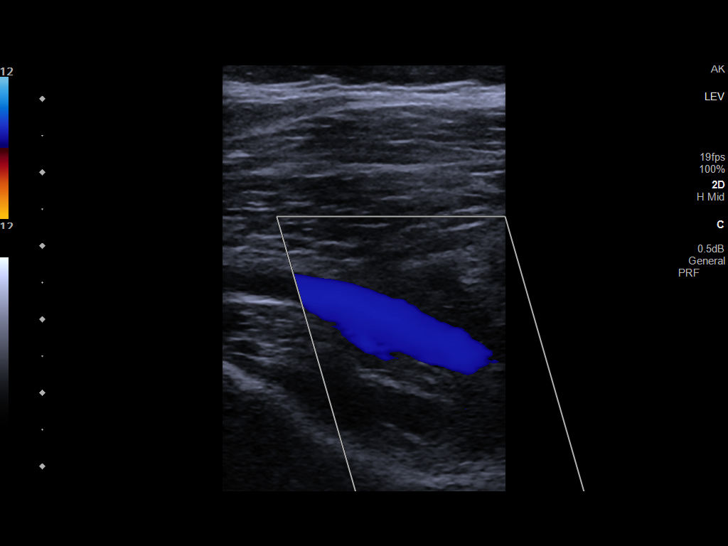
[im 60/60]
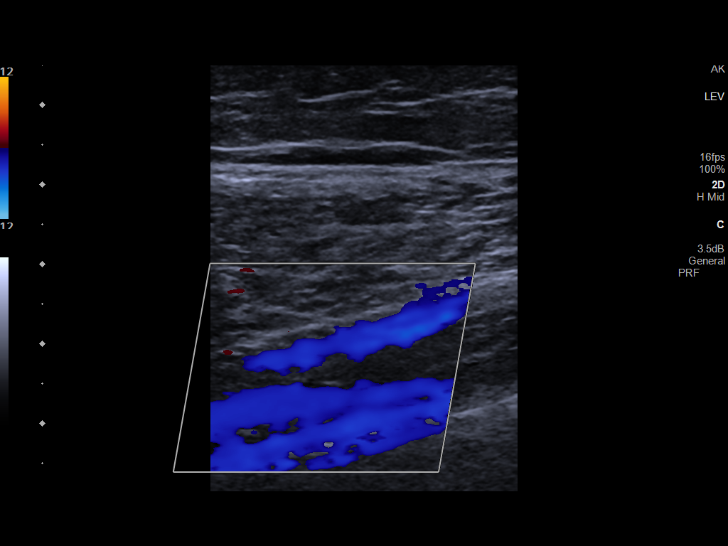

[13 of 24 positions shown; findings below may reference images not displayed]

FINDINGS: RIGHT LOWER EXTREMITY

Common Femoral Vein: No evidence of thrombus. Normal
compressibility, respiratory phasicity and response to augmentation.

Saphenofemoral Junction: No evidence of thrombus. Normal
compressibility and flow on color Doppler imaging.

Profunda Femoral Vein: No evidence of thrombus. Normal
compressibility and flow on color Doppler imaging.

Femoral Vein: No evidence of thrombus. Normal compressibility,
respiratory phasicity and response to augmentation.

Popliteal Vein: No evidence of thrombus. Normal compressibility,
respiratory phasicity and response to augmentation.

Calf Veins: No evidence of thrombus. Normal compressibility and flow
on color Doppler imaging.

Venous Reflux:  None.

Other Findings:  None.

LEFT LOWER EXTREMITY

Common Femoral Vein: No evidence of thrombus. Normal
compressibility, respiratory phasicity and response to augmentation.

Saphenofemoral Junction: No evidence of thrombus. Normal
compressibility and flow on color Doppler imaging.

Profunda Femoral Vein: No evidence of thrombus. Normal
compressibility and flow on color Doppler imaging.

Femoral Vein: No evidence of thrombus. Normal compressibility,
respiratory phasicity and response to augmentation.

Popliteal Vein: No evidence of thrombus. Normal compressibility,
respiratory phasicity and response to augmentation.

Calf Veins: No evidence of thrombus. Normal compressibility and flow
on color Doppler imaging.

Venous Reflux:  None.

Other Findings:  None.
IMPRESSION: No evidence of deep venous thrombosis in either lower extremity.

## 2022-06-08 ENCOUNTER — Encounter: Payer: Self-pay | Admitting: Advanced Practice Midwife

## 2022-06-08 ENCOUNTER — Ambulatory Visit (LOCAL_COMMUNITY_HEALTH_CENTER): Payer: Medicaid Other | Admitting: Advanced Practice Midwife

## 2022-06-08 VITALS — BP 109/73 | HR 87 | Ht 70.0 in | Wt 318.6 lb

## 2022-06-08 DIAGNOSIS — F32A Depression, unspecified: Secondary | ICD-10-CM

## 2022-06-08 DIAGNOSIS — Z789 Other specified health status: Secondary | ICD-10-CM

## 2022-06-08 DIAGNOSIS — E282 Polycystic ovarian syndrome: Secondary | ICD-10-CM

## 2022-06-08 DIAGNOSIS — R569 Unspecified convulsions: Secondary | ICD-10-CM

## 2022-06-08 DIAGNOSIS — I10 Essential (primary) hypertension: Secondary | ICD-10-CM

## 2022-06-08 DIAGNOSIS — Z3009 Encounter for other general counseling and advice on contraception: Secondary | ICD-10-CM | POA: Diagnosis not present

## 2022-06-08 DIAGNOSIS — E119 Type 2 diabetes mellitus without complications: Secondary | ICD-10-CM

## 2022-06-08 DIAGNOSIS — Z01419 Encounter for gynecological examination (general) (routine) without abnormal findings: Secondary | ICD-10-CM

## 2022-06-08 DIAGNOSIS — K589 Irritable bowel syndrome without diarrhea: Secondary | ICD-10-CM

## 2022-06-08 DIAGNOSIS — Z7289 Other problems related to lifestyle: Secondary | ICD-10-CM

## 2022-06-08 DIAGNOSIS — L680 Hirsutism: Secondary | ICD-10-CM

## 2022-06-08 NOTE — Progress Notes (Signed)
[facility] DEPARTMENT [facility] [address] Main Number: [phone]    Family Planning Visit- Initial Visit  Subjective:  Jasmine Sanchez is a 26 y.o. MWF exsmoker [id number]   being seen today for an initial annual visit and to discuss reproductive life planning.  The patient is currently using No Method - Other Reason for pregnancy prevention. Patient reports   does want a pregnancy in the next year.     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  Chief Complaint  Patient presents with   Annual Exam    Patient reports here for physical and pap. Hx 6 SAB's with last 2 wks ago at 8 wks. Wants pregnancy. LMP 04/01/22. Last sex 06/03/22 without condom; with current partner since 2022; 1 partner in last 3 mo. Not working, not in school. Liviing with husband and mother in law. Moving to [facility] 06/18/22. Last cig 11/2020. Last vaped 2018. Last cigar 5 years ago. Last MJ 2020. Last ETOH 02/02/22 (2 Margaritas). +cry 2x/wk, increased sleep but exhausted, low appetite, +irritable, -anhedonia, low energy level, -SI/HI. Cutter age 80-24. Depression/anxiety dx'd age 35 not on meds. Here with husband in exam room. Never had pap. Doesn't know when had last physical. Last dental exam never.   Patient denies need for insulin  Body mass index is 45.71 kg/m. - Patient is eligible for diabetes screening based on BMI and age >63?  not applicable HA1C ordered?  not applicable  Patient reports 1  partner/s in last year. Desires STI screening?  No - declines bloodwork  Has patient been screened once for HCV in the past?  No   Lab Results  Component Value Date   HCVAB NON REACTIVE 03/08/2021    Does the patient have current drug use (including MJ), have a partner with drug use, and/or has been incarcerated since last result? No  If yes-- Screen for HCV through [facility]   Does the patient meet criteria for HBV testing? No  Criteria:  -Household, sexual or needle sharing contact with HBV -History of drug use -HIV positive -Those with known Hep C   Health Maintenance Due  Topic Date Due   HEMOGLOBIN A1C  Never done   COVID-19 Vaccine (1) Never done   FOOT EXAM  Never done   OPHTHALMOLOGY EXAM  Never done   HPV VACCINES (1 - 2-dose series) Never done   TETANUS/TDAP  Never done   PAP-Cervical Cytology Screening  Never done   PAP SMEAR-Modifier  Never done   INFLUENZA VACCINE  05/18/2022    Review of Systems  Constitutional:  Positive for weight loss (45 lbs gain over last 2 years; not exercising; diabetic).  Eyes:  Positive for blurred vision (and double vision with right sided h/a only).  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    The following portions of the patient's history  were reviewed and updated as appropriate: allergies, current medications, past family history, past medical history, past social history, past surgical history and problem list. Problem list updated.   See flowsheet for other program required questions.  Objective:   Vitals:   06/08/22 1541  BP: 109/73  Pulse: 87  Weight: (!) 318 lb 9.6 oz (144.5 kg)  Height: 5\' 10"  (1.778 m)    Physical Exam Constitutional:      Appearance: Normal appearance. She is obese.  HENT:     Head: Normocephalic and atraumatic.      Mouth/Throat:     Mouth: Mucous membranes are moist.     Comments: Never had dental exam Eyes:     Conjunctiva/sclera: Conjunctivae normal.  Neck:     Thyroid: No thyroid mass, thyromegaly or thyroid tenderness.  Cardiovascular:     Rate and Rhythm: Normal rate and regular rhythm.  Pulmonary:     Effort: Pulmonary effort is normal.     Breath sounds: Normal breath sounds.  Chest:  Breasts:    Right: Normal.     Left: Normal.  Abdominal:     Palpations: Abdomen is soft.     Comments: Soft, poor tone, increased adipose, without masses or tenderness  Genitourinary:    General: Normal vulva.     Exam position: Lithotomy position.     Vagina: Vaginal discharge (white creamy leukorrhea, ph<4.5) present.     Cervix: Normal.     Uterus: Normal.      Rectum: Normal.     Comments: First pap done Difficult to assess uterus and ovaries due to increased adipose Musculoskeletal:        General: Normal range of motion.     Cervical back: Normal range of motion and neck supple.  Skin:    General: Skin is warm and dry.  Neurological:     Mental Status: She is alert.  Psychiatric:        Mood and Affect: Mood normal.       Assessment and Plan:  Jasmine Sanchez is a 26 y.o. female presenting to the [facility] Department for an initial annual wellness/contraceptive visit  Contraception counseling: Reviewed options based on patient desire and reproductive life plan. Patient is interested in No Method - Other Reason. This was not provided to the patient today. Pt wants to conceive. if not why not clearly documented  Risks, benefits, and typical effectiveness rates were reviewed.  Questions were answered.  Written information was also given to the patient to review.    The patient will follow up in  1 years for surveillance.  The patient was told to call with any further questions, or with any concerns about this method of contraception.  Emphasized use of condoms 100% of the  time for STI prevention.  Need for ECP was assessed. Patient reported > 120 hours .  Reviewed options and patient desired No method of ECP, declined all      2. Morbid obesity (HCC) 318 lbs with BMI=45.7  - WET PREP FOR TRICH, YEAST, CLUE  3. Family planning Please give pt [facility] contact info for consult as pt wants to conceive and has had 6 SAB's - Chlamydia/Gonorrhea [facility] - IGP, rfx Aptima HPV ASCU  4. Well woman exam with routine gynecological exam Treat wet mount per standing orders Immunization nurse consult  5. Hirsutism   6. Irritable bowel syndrome, unspecified type Referred to primary care MD or [facility]  7. PCOS (  polycystic ovarian syndrome)   8. 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 Primary hypertension 109/73 today and not on meds  10. Type 2 diabetes mellitus without complication, unspecified whether long term insulin use (HCC) Referred to primary care MD or [facility] for f/u and management  11. Depression, unspecified depression type Accepts contact info for Kathreen Cosier, LCSW--please give pt contact info     Return in about 1 year (around 06/09/2023) for yearly physical exam.  No future appointments.  Alberteen Spindle, CNM

## 2022-06-08 NOTE — Progress Notes (Signed)
Wet prep negative. Dell Briner, RN  

## 2022-06-09 LAB — WET PREP FOR TRICH, YEAST, CLUE
Trichomonas Exam: NEGATIVE
Yeast Exam: NEGATIVE

## 2022-06-11 LAB — IGP, RFX APTIMA HPV ASCU: PAP Smear Comment: 0
